# Patient Record
Sex: Female | Born: 1935 | Race: White | Smoking: Never smoker
Health system: Southern US, Community
[De-identification: ages and names within clinical notes are randomized; demographics above are authoritative.]

## PROBLEM LIST (undated history)

## (undated) DIAGNOSIS — F32A Depression, unspecified: Secondary | ICD-10-CM

## (undated) DIAGNOSIS — F419 Anxiety disorder, unspecified: Secondary | ICD-10-CM

## (undated) DIAGNOSIS — I4891 Unspecified atrial fibrillation: Secondary | ICD-10-CM

## (undated) DIAGNOSIS — K222 Esophageal obstruction: Secondary | ICD-10-CM

## (undated) DIAGNOSIS — I442 Atrioventricular block, complete: Secondary | ICD-10-CM

## (undated) DIAGNOSIS — K5792 Diverticulitis of intestine, part unspecified, without perforation or abscess without bleeding: Secondary | ICD-10-CM

## (undated) DIAGNOSIS — E785 Hyperlipidemia, unspecified: Secondary | ICD-10-CM

## (undated) DIAGNOSIS — F329 Major depressive disorder, single episode, unspecified: Secondary | ICD-10-CM

## (undated) DIAGNOSIS — M1990 Unspecified osteoarthritis, unspecified site: Secondary | ICD-10-CM

## (undated) DIAGNOSIS — I10 Essential (primary) hypertension: Secondary | ICD-10-CM

## (undated) HISTORY — PX: APPENDECTOMY: SHX54

## (undated) HISTORY — DX: Depression, unspecified: F32.A

## (undated) HISTORY — DX: Hyperlipidemia, unspecified: E78.5

## (undated) HISTORY — DX: Unspecified osteoarthritis, unspecified site: M19.90

## (undated) HISTORY — PX: CHOLECYSTECTOMY: SHX55

## (undated) HISTORY — DX: Diverticulitis of intestine, part unspecified, without perforation or abscess without bleeding: K57.92

## (undated) HISTORY — PX: ABDOMINAL HYSTERECTOMY: SHX81

## (undated) HISTORY — DX: Major depressive disorder, single episode, unspecified: F32.9

## (undated) HISTORY — DX: Esophageal obstruction: K22.2

## (undated) HISTORY — DX: Anxiety disorder, unspecified: F41.9

## (undated) HISTORY — DX: Unspecified atrial fibrillation: I48.91

## (undated) HISTORY — DX: Essential (primary) hypertension: I10

---

## 2013-11-11 ENCOUNTER — Encounter: Payer: Self-pay | Admitting: Gastroenterology

## 2013-11-16 ENCOUNTER — Encounter: Payer: Self-pay | Admitting: Gastroenterology

## 2013-11-16 ENCOUNTER — Ambulatory Visit (INDEPENDENT_AMBULATORY_CARE_PROVIDER_SITE_OTHER): Payer: Medicare Other | Admitting: Gastroenterology

## 2013-11-16 VITALS — BP 132/68 | HR 72 | Ht 64.0 in | Wt 119.6 lb

## 2013-11-16 DIAGNOSIS — R1314 Dysphagia, pharyngoesophageal phase: Secondary | ICD-10-CM

## 2013-11-16 NOTE — Patient Instructions (Addendum)
You will be set up for an upper endoscopy (balloon dilation likely) for dysphagia. You should consider dentures on the bottom, this will help your chewing and swallowing. Chew your food well, eat slowly, take small bites.

## 2013-11-16 NOTE — Progress Notes (Signed)
HPI: This is a   very pleasant 80 year old woman who is here with her daughter today  At night, while laying down. She coughs a lot.  + intermittent dysphagia.  Trouble breathing when laying down.  Has been on oxygen.  Has afib but not on blood thinners because of frequent falls.  She is very unclear about  Food will hang, catch with eating.  She does not have bottom teeth  Her weight has been stable if not increasing a bit since she moved in with her daughter  [place].  [place]ophagus [place] in [place] about 2[place] ago (in [place] She doesn't know what they found or exactly what they did.  She cannot recall the name of the physician, where it was done, the name of the facility where it was done, exactly when that was done. She doesn't recall the name of her primary care physician who referred to the gastroenterologist and thinks he has retired any   Review of systems: Pertinent positive and negative review of systems were noted in the above HPI section. Complete review of systems was performed and was otherwise normal.    History reviewed. No pertinent past medical history.  Past Surgical History  Procedure Laterality Date  . Appendectomy    . Cholecystectomy    . Abdominal hysterectomy      Current Outpatient Prescriptions  Medication Sig Dispense Refill  . amiodarone (PACERONE) 200 MG tablet Take 200 mg by mouth 2 (two) times daily.      Marland Kitchen. aspirin 81 MG tablet Take 81 mg by mouth daily.      . calcium carbonate (OS-CAL) 600 MG TABS tablet Take 600 mg by mouth daily with breakfast.      . ezetimibe (ZETIA) 10 MG tablet Take 10 mg by mouth daily.      . Inulin (METAMUCIL CLEAR & NATURAL) POWD Take by mouth as directed.      . mirtazapine (REMERON) 15 MG tablet Take 15 mg by mouth at bedtime.      Marland Kitchen. omeprazole (PRILOSEC) 40 MG capsule Take 40 mg by mouth daily.      . raloxifene (EVISTA) 60 MG tablet Take 60 mg by mouth daily.       No current  facility-administered medications for this visit.    Allergies as of 11/16/2013 - Review Complete 11/16/2013  Allergen Reaction Noted  . Sulfa antibiotics  11/16/2013    No family history on file.  History   Social History  . Marital Status: Unknown    Spouse Name: N/A    Number of Children: 4  . Years of Education: N/A   Occupational History  . Retired    Social History Main Topics  . Smoking status: Never Smoker   . Smokeless tobacco: Never Used  . Alcohol Use: Yes  . Drug Use: No  . Sexual Activity: Not on file   Other Topics Concern  . Not on file   Social History Narrative  . No narrative on file       Physical Exam: BP 132/68  Pulse 72  Ht 5\' 4"  (1.626 m)  Wt 119 lb 9.6 oz (54.25 kg)  BMI 20.52 kg/m2 Constitutional: Frail, walks with a walker Psychiatric: alert and oriented x3 Eyes: extraocular movements intact Mouth: oral pharynx moist, no lesions; no bottom teeth Neck: supple no lymphadenopathy Cardiovascular: heart regular rate and rhythm Lungs: clear to auscultation bilaterally Abdomen: soft, nontender, nondistended, no obvious ascites, no peritoneal signs, normal  bowel sounds Extremities: no lower extremity edema bilaterally Skin: no lesions on visible extremities    Assessment and plan: 80 y.o. female with  intermittent dysphagia to solid foods   it does not eem like we willble to track down her previous EGD records,   Since  she is unable to recall exactly when it was done, where was done, who did it, her primary care physician around that time has retired. He does seem that she has intermittent solid food dysphagia. She has no bottom teeth and I explained to her that this could likely contributes to her swallowing difficulty. I urged her to consider getting good fitting bottom teeth. I would like to proceed with EGD at her soonest convenience as well.

## 2013-11-30 ENCOUNTER — Encounter: Payer: Self-pay | Admitting: Gastroenterology

## 2013-11-30 ENCOUNTER — Ambulatory Visit (AMBULATORY_SURGERY_CENTER): Payer: Medicaid Other | Admitting: Gastroenterology

## 2013-11-30 VITALS — BP 127/69 | HR 65 | Temp 98.2°F | Resp 20 | Ht 64.0 in | Wt 119.0 lb

## 2013-11-30 DIAGNOSIS — R1314 Dysphagia, pharyngoesophageal phase: Secondary | ICD-10-CM

## 2013-11-30 DIAGNOSIS — K449 Diaphragmatic hernia without obstruction or gangrene: Secondary | ICD-10-CM

## 2013-11-30 DIAGNOSIS — K222 Esophageal obstruction: Secondary | ICD-10-CM

## 2013-11-30 MED ORDER — SODIUM CHLORIDE 0.9 % IV SOLN: 500.0000 mL | INTRAVENOUS | Status: DC

## 2013-11-30 NOTE — Patient Instructions (Signed)
YOU HAD AN ENDOSCOPIC PROCEDURE TODAY AT THE [facility]: Refer to the procedure report that was given to you for any specific questions about what was found during the examination.  If the procedure report does not answer your questions, please call your gastroenterologist to clarify.  If you requested that your care partner not be given the details of your procedure findings, then the procedure report has been included in a sealed envelope for you to review at your convenience later.  YOU SHOULD EXPECT: Some feelings of bloating in the abdomen. Passage of more gas than usual.  Walking can help get rid of the air that was put into your GI tract during the procedure and reduce the bloating. If you had a lower endoscopy (such as a colonoscopy or flexible sigmoidoscopy) you may notice spotting of blood in your stool or on the toilet paper. If you underwent a bowel prep for your procedure, then you may not have a normal bowel movement for a few days.  DIET: FOLLOW DILATION HANDOUT.  ACTIVITY: Your care partner should take you home directly after the procedure.  You should plan to take it easy, moving slowly for the rest of the day.  You can resume normal activity the day after the procedure however you should NOT DRIVE or use heavy machinery for 24 hours (because of the sedation medicines used during the test).    SYMPTOMS TO REPORT IMMEDIATELY: A gastroenterologist can be reached at any hour.  During normal business hours, 8:30 AM to 5:00 PM Monday through Friday, call [phone].  After hours and on weekends, please call the GI answering service at [phone] who will take a message and have the physician on call contact you.    Following upper endoscopy (EGD)  Vomiting of blood or coffee ground material  New chest pain or pain under the shoulder blades  Painful or persistently difficult swallowing  New shortness of breath  Fever of 100F or higher  Black, tarry-looking  stools  FOLLOW UP: If any biopsies were taken you will be contacted by phone or by letter within the next 1-3 weeks.  Call your gastroenterologist if you have not heard about the biopsies in 3 weeks.  Our staff will call the home number listed on your records the next business day following your procedure to check on you and address any questions or concerns that you may have at that time regarding the information given to you following your procedure. This is a courtesy call and so if there is no answer at the home number and we have not heard from you through the emergency physician on call, we will assume that you have returned to your regular daily activities without incident.  SIGNATURES/CONFIDENTIALITY: You and/or your care partner have signed paperwork which will be entered into your electronic medical record.  These signatures attest to the fact that that the information above on your After Visit Summary has been reviewed and is understood.  Full responsibility of the confidentiality of this discharge information lies with you and/or your care-partner.  Resume medications. Information given on Dilation Diet.

## 2013-11-30 NOTE — Op Note (Signed)
[facility] [address] [place], [phone]   ENDOSCOPY PROCEDURE REPORT  PATIENT: Julie Serrano, Julie H.  MR#: [id number] BIRTHDATE: May 11, 1925 , 89  yrs. old GENDER: Female ENDOSCOPIST: Rachael Feeaniel P Kynnedi Zweig, MD PROCEDURE DATE:  11/30/2013 PROCEDURE:  EGD, balloon dilation ASA CLASS:     Class III INDICATIONS:  dysphagia, previous EGD (elsewhere) and dilation 2-3 years ago with good relief of same symptoms. MEDICATIONS: MAC sedation, administered by CRNA and Propofol (Diprivan) 120 mg IV TOPICAL ANESTHETIC: none  DESCRIPTION OF PROCEDURE: After the risks benefits and alternatives of the procedure were thoroughly explained, informed consent was obtained.  The LB [id number] endoscope was introduced through the mouth and advanced to the second portion of the duodenum. Without limitations.  The instrument was slowly withdrawn as the mucosa was fully examined.   There was a 13mm Schatzki's ring at GE junction above a 4cm hiatal hernia.  The examination was otherwise normal.  The ring was dilated to 18mm using a CRE TTS balloon held inflated for 1 minute. There was the typical minor superficial tear and self limited oozing of blood following dilation.  Retroflexed views revealed no abnormalities.     The scope was then withdrawn from the patient and the procedure completed. COMPLICATIONS: There were no complications.  ENDOSCOPIC IMPRESSION: There was a 13mm Schatzki's ring at GE junction above a 4cm hiatal hernia.  The examination was otherwise normal.  The ring was dilated to 18mm using a CRE TTS balloon held inflated for 1 minute. There was the typical minor superficial tear and self limited oozing of blood following dilation.  RECOMMENDATIONS: Chew your food well, eat slowly and take small bites.  You should consider getting bottom dentures to help you chew better as well.   eSigned:  Rachael Feeaniel P Emari Demmer, MD 11/30/2013 2:28 PM   CC: Garnette Scheuermannhomas Parrish,  MD

## 2013-11-30 NOTE — Progress Notes (Signed)
Called to room to assist during endoscopic procedure.  Patient ID and intended procedure confirmed with present staff. Received instructions for my participation in the procedure from the performing physician.  

## 2013-11-30 NOTE — Progress Notes (Signed)
Report to pacu rn, vss, bbs=clear 

## 2013-12-01 ENCOUNTER — Telehealth: Payer: Self-pay

## 2013-12-01 NOTE — Telephone Encounter (Signed)
  Follow up Call-  Call back number 11/30/2013  Post procedure Call Back phone  # [phone]  Permission to leave phone message Yes     Patient questions:  Do you have a fever, pain , or abdominal swelling? no Pain Score  0 *  Have you tolerated food without any problems? yes  Have you been able to return to your normal activities? yes  Do you have any questions about your discharge instructions: Diet   no Medications  no Follow up visit  no  Do you have questions or concerns about your Care? no  Actions: * If pain score is 4 or above: No action needed, pain <4.

## 2013-12-26 ENCOUNTER — Emergency Department (HOSPITAL_COMMUNITY): Payer: Medicare Other

## 2013-12-26 ENCOUNTER — Emergency Department (HOSPITAL_COMMUNITY)
Admission: EM | Admit: 2013-12-26 | Discharge: 2013-12-26 | Disposition: A | Discharge status: Home / Self Care | Payer: Medicare Other | Source: Home / Self Care | Attending: Emergency Medicine | Admitting: Emergency Medicine

## 2013-12-26 ENCOUNTER — Encounter (HOSPITAL_COMMUNITY): Payer: Self-pay | Admitting: Emergency Medicine

## 2013-12-26 DIAGNOSIS — R42 Dizziness and giddiness: Secondary | ICD-10-CM

## 2013-12-26 LAB — COMPREHENSIVE METABOLIC PANEL
ALT: 25 U/L (ref 0–35)
AST: 33 U/L (ref 0–37)
Albumin: 3.2 g/dL — ABNORMAL LOW (ref 3.5–5.2)
Alkaline Phosphatase: 48 U/L (ref 39–117)
BUN: 10 mg/dL (ref 6–23)
CALCIUM: 8.4 mg/dL (ref 8.4–10.5)
CO2: 25 mEq/L (ref 19–32)
CREATININE: 0.98 mg/dL (ref 0.50–1.10)
Chloride: 103 mEq/L (ref 96–112)
GFR calc non Af Amer: 50 mL/min — ABNORMAL LOW (ref >90)
GFR, EST AFRICAN AMERICAN: 58 mL/min — AB (ref >90)
GLUCOSE: 120 mg/dL — AB (ref 70–99)
Potassium: 3.9 mEq/L (ref 3.7–5.3)
Sodium: 138 mEq/L (ref 137–147)
Total Bilirubin: 0.3 mg/dL (ref 0.3–1.2)
Total Protein: 6 g/dL (ref 6.0–8.3)

## 2013-12-26 LAB — CBC WITH DIFFERENTIAL/PLATELET
Basophils Absolute: 0 10*3/uL (ref 0.0–0.1)
Basophils Relative: 0 % (ref 0–1)
EOS ABS: 0.1 10*3/uL (ref 0.0–0.7)
EOS PCT: 1 % (ref 0–5)
HCT: 37.7 % (ref 36.0–46.0)
Hemoglobin: 12.6 g/dL (ref 12.0–15.0)
LYMPHS ABS: 0.9 10*3/uL (ref 0.7–4.0)
LYMPHS PCT: 15 % (ref 12–46)
MCH: 31.5 pg (ref 26.0–34.0)
MCHC: 33.4 g/dL (ref 30.0–36.0)
MCV: 94.3 fL (ref 78.0–100.0)
MONO ABS: 0.6 10*3/uL (ref 0.1–1.0)
Monocytes Relative: 9 % (ref 3–12)
Neutro Abs: 4.7 10*3/uL (ref 1.7–7.7)
Neutrophils Relative %: 75 % (ref 43–77)
Platelets: 147 10*3/uL — ABNORMAL LOW (ref 150–400)
RBC: 4 MIL/uL (ref 3.87–5.11)
RDW: 13.5 % (ref 11.5–15.5)
WBC: 6.3 10*3/uL (ref 4.0–10.5)

## 2013-12-26 LAB — URINALYSIS, ROUTINE W REFLEX MICROSCOPIC
BILIRUBIN URINE: NEGATIVE
Glucose, UA: NEGATIVE mg/dL
Ketones, ur: NEGATIVE mg/dL
LEUKOCYTES UA: NEGATIVE
NITRITE: NEGATIVE
Protein, ur: NEGATIVE mg/dL
SPECIFIC GRAVITY, URINE: 1.01 (ref 1.005–1.030)
UROBILINOGEN UA: 0.2 mg/dL (ref 0.0–1.0)
pH: 7 (ref 5.0–8.0)

## 2013-12-26 LAB — URINE MICROSCOPIC-ADD ON

## 2013-12-26 LAB — TROPONIN I: Troponin I: <0.3 ng/mL (ref <0.30)

## 2013-12-26 NOTE — ED Provider Notes (Signed)
CSN: [id number]     Arrival date & time 12/26/13  1703 History  This chart was scribed for Gerhard Munchobert Baruch Lewers, MD by Shari HeritageAisha Amuda, ED Scribe. The patient was seen in room APA19/APA19. Patient's care was started at 5:05 PM.  Chief Complaint  Patient presents with  . Loss of Consciousness    The history is provided by the patient. No language interpreter was used.    HPI Comments: Julie Serrano is a 80 y.o. female with history of atrial fibrillation, hypertension, hyperlipidemia who presents to the Emergency Department complaining of dizziness that began 1-2 hours ago. Patient reports that she was trying to get out of the car and started to feel unsteady. She denies a fall or injury after this episode. She denies loss of consciousness or lightheadedness.She says that she has been experiencing intermittent dizziness for the past one month and she describes this as a room-spinning sensation. She says that episodes do not last "too long," but cannot state exactly how long they are. Patient also states that she has some weakness in her lower extremities at baseline. She has had some congestion and shortness of breath over the past few days. She denies pain or any other complaints at this time. Patient's other medical history includes anxiety and depression, and diverticulitis. She has history of MI or other cardiac disease.   Past Medical History  Diagnosis Date  . Anxiety and depression   . Arrhythmia   . Arthritis   . Atrial fibrillation   . Hypertension   . Hyperlipemia   . Diverticulitis   . Esophageal stricture    Past Surgical History  Procedure Laterality Date  . Appendectomy    . Cholecystectomy    . Abdominal hysterectomy     No family history on file. History  Substance Use Topics  . Smoking status: Never Smoker   . Smokeless tobacco: Never Used  . Alcohol Use: Yes   OB History   Grav Para Term Preterm Abortions TAB SAB Ect Mult Living                 Review of Systems   Constitutional:       Per HPI, otherwise negative  HENT:       Per HPI, otherwise negative  Respiratory:       Per HPI, otherwise negative  Cardiovascular:       Per HPI, otherwise negative  Gastrointestinal: Negative for vomiting.  Endocrine:       Negative aside from HPI  Genitourinary:       Neg aside from HPI   Musculoskeletal:       Per HPI, otherwise negative  Skin: Negative.   Neurological: Positive for dizziness and weakness. Negative for syncope and headaches.     Allergies  Sulfa antibiotics  Home Medications   Prior to Admission medications   Medication Sig Start Date End Date Taking? Authorizing Provider  amiodarone (PACERONE) 200 MG tablet Take 200 mg by mouth 2 (two) times daily.    Historical Provider, MD  aspirin 81 MG tablet Take 81 mg by mouth daily.    Historical Provider, MD  calcium carbonate (OS-CAL) 600 MG TABS tablet Take 600 mg by mouth daily with breakfast.    Historical Provider, MD  ezetimibe (ZETIA) 10 MG tablet Take 10 mg by mouth daily.    Historical Provider, MD  guaiFENesin-dextromethorphan (ROBITUSSIN DM) 100-10 MG/5ML syrup Take 5 mLs by mouth every 8 (eight) hours as needed for cough.  Historical Provider, MD  Inulin (METAMUCIL CLEAR & NATURAL) POWD Take by mouth as directed.    Historical Provider, MD  mirtazapine (REMERON) 15 MG tablet Take 15 mg by mouth at bedtime.    Historical Provider, MD  omeprazole (PRILOSEC) 40 MG capsule Take 40 mg by mouth daily.    Historical Provider, MD  raloxifene (EVISTA) 60 MG tablet Take 60 mg by mouth daily.    Historical Provider, MD   Triage Vitals: BP 138/65  Pulse 62  Temp(Src) 98.6 F (37 C) (Oral)  Resp 18  SpO2 95% Physical Exam  Nursing note and vitals reviewed. Constitutional: She is oriented to person, place, and time. She appears well-developed and well-nourished. No distress.  HENT:  Head: Normocephalic and atraumatic.  Eyes: Conjunctivae and EOM are normal.  Cardiovascular:  Normal rate and regular rhythm.   Murmur heard. Pulmonary/Chest: Effort normal and breath sounds normal. No stridor. No respiratory distress.  Abdominal: She exhibits no distension.  Musculoskeletal: She exhibits no edema.  4/5 strength in all four extremities. Normal grip strength.  Neurological: She is alert and oriented to person, place, and time. No cranial nerve deficit.  No confusion. No dis-coordination.  Skin: Skin is warm and dry.  Psychiatric: She has a normal mood and affect.    ED Course  Procedures (including critical care time)  COORDINATION OF CARE: 5:22 PM- Will order head CT, CXR, CBC with diff, CMP, UA, and troponin. Patient informed of current plan for treatment and evaluation and agrees with plan at this time.    Labs Review Labs Reviewed  CBC WITH DIFFERENTIAL - Abnormal; Notable for the following:    Platelets 147 (*)    All other components within normal limits  COMPREHENSIVE METABOLIC PANEL - Abnormal; Notable for the following:    Glucose, Bld 120 (*)    Albumin 3.2 (*)    GFR calc non Af Amer 50 (*)    GFR calc Af Amer 58 (*)    All other components within normal limits  TROPONIN I  URINALYSIS, ROUTINE W REFLEX MICROSCOPIC    Imaging Review Dg Chest 2 View  12/26/2013   CLINICAL DATA:  Chest discomfort. Cough. Shortness of breath. Altered mental status.  EXAM: CHEST  2 VIEW  COMPARISON:  None.  FINDINGS: Mild cardiomegaly and ectasia of the thoracic aorta noted. No evidence of pulmonary edema or other infiltrate. No evidence of pleural effusion. No mass or lymphadenopathy identified.  IMPRESSION: Mild cardiomegaly.  No active lung disease.   Electronically Signed   By: Myles RosenthalJohn  Stahl M.D.   On: 12/26/2013 18:43   Ct Head Wo Contrast  12/26/2013   CLINICAL DATA:  Altered mental status. Hypertension and atrial fibrillation.  EXAM: CT HEAD WITHOUT CONTRAST  TECHNIQUE: Contiguous axial images were obtained from the base of the skull through the vertex without  intravenous contrast.  COMPARISON:  None.  FINDINGS: There is no evidence of intracranial hemorrhage, brain edema, or other signs of acute infarction. There is no evidence of intracranial mass lesion or mass effect. No abnormal extraaxial fluid collections are identified.  Mild diffuse cerebral atrophy noted as well as chronic small vessel disease. Ventricles are normal in size. No skull abnormality identified.  IMPRESSION: No acute intracranial abnormality.  Mild diffuse cerebral atrophy and chronic small vessel disease.   Electronically Signed   By: Myles RosenthalJohn  Stahl M.D.   On: 12/26/2013 18:21     EKG Interpretation   Date/Time:  Saturday December 26 2013 17:08:56 EDT  Ventricular Rate:  61 PR Interval:  194 QRS Duration: 216 QT Interval:  560 QTC Calculation: 563 R Axis:   -16 Text Interpretation:  Normal sinus rhythm Possible Left atrial enlargement  Left bundle branch block Abnormal ECG No previous ECGs available Sinus  rhythm Left bundle branch block Abnormal ekg Confirmed by Gerhard Munch   MD [phone]) on 12/26/2013 5:23:47 PM     On repeat exam, the patient is in no distress, sitting upright. She, her family members had a lengthy conversation about the possible causes for her dizziness.  I recommended specific all questions, imaging studies to pursue as an outpatient.  Absent distress, and with multiple prior similar events over some time, there is low suspicion for acute new pathology. MDM   Final diagnoses:  Dizziness   I personally performed the services described in this documentation, which was scribed in my presence. The recorded information has been reviewed and is accurate.  Patient presents after an episode of dizziness, but not near syncope. Patient is neurologically intact and hemodynamically stable, and in no distress. Patient's chronic dizzy episodes, the absence of distress, and the aforementioned reassuring findings an interpreter for further evaluation, management as an  outpatient.    Gerhard Munch, MD 12/27/13 [phone]

## 2013-12-26 NOTE — ED Notes (Signed)
Patient lying on bed; family at bedside.  Patient alert.  Respirations even and unlabored; able to speak in complete sentences without difficulty.  Patient denies pain at this time.
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
## 2013-12-26 NOTE — Discharge Instructions (Signed)
As discussed, your evaluation tonight has been largely reassuring.  Please be sure to follow up with your physician for further evaluation and management.  Additional evaluation is required, and you should discuss carotid vein studies, echocardiogram, possibly MRI to be done as an outpatient.  1 if you develop new, or concerning changes in the interim, please be sure to return here for further evaluation.   Dizziness Dizziness is a common problem. It is a feeling of unsteadiness or lightheadedness. You may feel like you are about to faint. Dizziness can lead to injury if you stumble or fall. A person of any age group can suffer from dizziness, but dizziness is more common in older adults. CAUSES  Dizziness can be caused by many different things, including:  Middle ear problems.  Standing for too long.  Infections.  An allergic reaction.  Aging.  An emotional response to something, such as the sight of blood.  Side effects of medicines.  Fatigue.  Problems with circulation or blood pressure.  Excess use of alcohol, medicines, or illegal drug use.  Breathing too fast (hyperventilation).  An arrhythmia or problems with your heart rhythm.  Low red blood cell count (anemia).  Pregnancy.  Vomiting, diarrhea, fever, or other illnesses that cause dehydration.  Diseases or conditions such as Parkinson's disease, high blood pressure (hypertension), diabetes, and thyroid problems.  Exposure to extreme heat. DIAGNOSIS  To find the cause of your dizziness, your caregiver may do a physical exam, lab tests, radiologic imaging scans, or an electrocardiography test (ECG).  TREATMENT  Treatment of dizziness depends on the cause of your symptoms and can vary greatly. HOME CARE INSTRUCTIONS   Drink enough fluids to keep your urine clear or pale yellow. This is especially important in very hot weather. In the elderly, it is also important in cold weather.  If your dizziness is  caused by medicines, take them exactly as directed. When taking blood pressure medicines, it is especially important to get up slowly.  Rise slowly from chairs and steady yourself until you feel okay.  In the morning, first sit up on the side of the bed. When this seems okay, stand slowly while holding onto something until you know your balance is fine.  If you need to stand in one place for a long time, be sure to move your legs often. Tighten and relax the muscles in your legs while standing.  If dizziness continues to be a problem, have someone stay with you for a day or two. Do this until you feel you are well enough to stay alone. Have the person call your caregiver if he or she notices changes in you that are concerning.  Do not drive or use heavy machinery if you feel dizzy.  Do not drink alcohol. SEEK IMMEDIATE MEDICAL CARE IF:   Your dizziness or lightheadedness gets worse.  You feel nauseous or vomit.  You develop problems with talking, walking, weakness, or using your arms, hands, or legs.  You are not thinking clearly or you have difficulty forming sentences. It may take a friend or family member to determine if your thinking is normal.  You develop chest pain, abdominal pain, shortness of breath, or sweating.  Your vision changes.  You notice any bleeding.  You have side effects from medicine that seems to be getting worse rather than better. MAKE SURE YOU:   Understand these instructions.  Will watch your condition.  Will get help right away if you are not doing well  or get worse. Document Released: 02/20/2001 Document Revised: 11/19/2011 Document Reviewed: 03/16/2011 [facility] Patient Information 2014 [employer].

## 2013-12-26 NOTE — ED Notes (Signed)
Dr. Jeraldine LootsLockwood in to speak with patient regarding discharge.

## 2013-12-26 NOTE — ED Notes (Signed)
Discharge instructions given and reviewed with patient and daughter.  Daughter verbalized understanding to follow up with PMD next week for further management of dizziness.

## 2013-12-26 NOTE — ED Notes (Signed)
Dressing applied to left lower leg.  Non-stick dressing applied and leg wrapped with kerlix.  Patient tolerated well.

## 2013-12-27 ENCOUNTER — Ambulatory Visit (HOSPITAL_COMMUNITY): Admit: 2013-12-27 | Payer: Self-pay | Admitting: Interventional Cardiology

## 2013-12-27 ENCOUNTER — Encounter (HOSPITAL_COMMUNITY)
Admission: EM | Disposition: A | Discharge status: Skilled Nursing Facility | Payer: Self-pay | Source: Home / Self Care | Attending: Cardiology

## 2013-12-27 ENCOUNTER — Inpatient Hospital Stay (HOSPITAL_COMMUNITY)
Admission: EM | Admit: 2013-12-27 | Discharge: 2013-12-30 | DRG: 242 | Disposition: A | Discharge status: Skilled Nursing Facility | Payer: Medicare Other | Attending: Cardiology | Admitting: Cardiology

## 2013-12-27 ENCOUNTER — Encounter (HOSPITAL_COMMUNITY): Payer: Self-pay | Admitting: Emergency Medicine

## 2013-12-27 DIAGNOSIS — E876 Hypokalemia: Secondary | ICD-10-CM | POA: Diagnosis present

## 2013-12-27 DIAGNOSIS — R410 Disorientation, unspecified: Secondary | ICD-10-CM | POA: Diagnosis present

## 2013-12-27 DIAGNOSIS — R55 Syncope and collapse: Secondary | ICD-10-CM | POA: Diagnosis present

## 2013-12-27 DIAGNOSIS — I4891 Unspecified atrial fibrillation: Secondary | ICD-10-CM | POA: Diagnosis present

## 2013-12-27 DIAGNOSIS — IMO0002 Reserved for concepts with insufficient information to code with codable children: Secondary | ICD-10-CM

## 2013-12-27 DIAGNOSIS — Z794 Long term (current) use of insulin: Secondary | ICD-10-CM

## 2013-12-27 DIAGNOSIS — F0390 Unspecified dementia without behavioral disturbance: Secondary | ICD-10-CM | POA: Diagnosis present

## 2013-12-27 DIAGNOSIS — R5381 Other malaise: Secondary | ICD-10-CM | POA: Diagnosis present

## 2013-12-27 DIAGNOSIS — F329 Major depressive disorder, single episode, unspecified: Secondary | ICD-10-CM | POA: Diagnosis present

## 2013-12-27 DIAGNOSIS — E785 Hyperlipidemia, unspecified: Secondary | ICD-10-CM | POA: Diagnosis present

## 2013-12-27 DIAGNOSIS — F3289 Other specified depressive episodes: Secondary | ICD-10-CM | POA: Diagnosis present

## 2013-12-27 DIAGNOSIS — I447 Left bundle-branch block, unspecified: Secondary | ICD-10-CM | POA: Diagnosis present

## 2013-12-27 DIAGNOSIS — I442 Atrioventricular block, complete: Principal | ICD-10-CM

## 2013-12-27 DIAGNOSIS — F411 Generalized anxiety disorder: Secondary | ICD-10-CM | POA: Diagnosis present

## 2013-12-27 DIAGNOSIS — Z7982 Long term (current) use of aspirin: Secondary | ICD-10-CM

## 2013-12-27 DIAGNOSIS — I10 Essential (primary) hypertension: Secondary | ICD-10-CM | POA: Diagnosis present

## 2013-12-27 DIAGNOSIS — M129 Arthropathy, unspecified: Secondary | ICD-10-CM | POA: Diagnosis present

## 2013-12-27 DIAGNOSIS — I214 Non-ST elevation (NSTEMI) myocardial infarction: Secondary | ICD-10-CM | POA: Diagnosis not present

## 2013-12-27 DIAGNOSIS — Z9181 History of falling: Secondary | ICD-10-CM

## 2013-12-27 HISTORY — PX: LEFT HEART CATHETERIZATION WITH CORONARY ANGIOGRAM: SHX5451

## 2013-12-27 HISTORY — DX: Atrioventricular block, complete: I44.2

## 2013-12-27 LAB — CBG MONITORING, ED: GLUCOSE-CAPILLARY: 161 mg/dL — AB (ref 70–99)

## 2013-12-27 SURGERY — LEFT HEART CATHETERIZATION WITH CORONARY ANGIOGRAM
Anesthesia: LOCAL

## 2013-12-27 MED ORDER — EZETIMIBE 10 MG PO TABS
10.0000 mg | ORAL_TABLET | Freq: Every day | ORAL | Status: DC
  Administered 2013-12-28 – 2013-12-29 (×2): 10 mg via ORAL
  Filled 2013-12-27 (×4): qty 1

## 2013-12-27 MED ORDER — ENOXAPARIN SODIUM 40 MG/0.4ML ~~LOC~~ SOLN
40.0000 mg | SUBCUTANEOUS | Status: DC
  Filled 2013-12-27: qty 0.4

## 2013-12-27 MED ORDER — DOPAMINE-DEXTROSE 3.2-5 MG/ML-% IV SOLN
INTRAVENOUS | Status: AC
  Administered 2013-12-27: 800 mg
  Filled 2013-12-27: qty 250

## 2013-12-27 MED ORDER — FENTANYL CITRATE 0.05 MG/ML IJ SOLN
12.5000 ug | Freq: Once | INTRAMUSCULAR | Status: AC
  Administered 2013-12-27: 12.5 ug via INTRAVENOUS
  Filled 2013-12-27: qty 2

## 2013-12-27 MED ORDER — SODIUM CHLORIDE 0.9 % IV SOLN
Freq: Once | INTRAVENOUS | Status: AC
  Administered 2013-12-27: 22:00:00 via INTRAVENOUS

## 2013-12-27 MED ORDER — ATROPINE SULFATE 0.1 MG/ML IJ SOLN
1.0000 mg | Freq: Once | INTRAMUSCULAR | Status: AC
  Administered 2013-12-27: 1 mg via INTRAVENOUS

## 2013-12-27 MED ORDER — ASPIRIN EC 81 MG PO TBEC: 81.0000 mg | DELAYED_RELEASE_TABLET | Freq: Every day | ORAL | Status: DC

## 2013-12-27 MED ORDER — ASPIRIN EC 81 MG PO TBEC
81.0000 mg | DELAYED_RELEASE_TABLET | Freq: Every day | ORAL | Status: DC
  Administered 2013-12-28 – 2013-12-30 (×3): 81 mg via ORAL
  Filled 2013-12-27 (×3): qty 1

## 2013-12-27 MED ORDER — NITROGLYCERIN 0.4 MG SL SUBL: 0.4000 mg | SUBLINGUAL_TABLET | SUBLINGUAL | Status: DC | PRN

## 2013-12-27 MED ORDER — SODIUM CHLORIDE 0.9 % IJ SOLN
3.0000 mL | Freq: Two times a day (BID) | INTRAMUSCULAR | Status: DC
  Administered 2013-12-28 – 2013-12-30 (×5): 3 mL via INTRAVENOUS

## 2013-12-27 MED ORDER — SODIUM CHLORIDE 0.9 % IV SOLN: INTRAVENOUS | Status: DC

## 2013-12-27 MED ORDER — PANTOPRAZOLE SODIUM 40 MG PO TBEC
40.0000 mg | DELAYED_RELEASE_TABLET | Freq: Every day | ORAL | Status: DC
  Administered 2013-12-28 – 2013-12-30 (×3): 40 mg via ORAL
  Filled 2013-12-27 (×3): qty 1

## 2013-12-27 MED ORDER — SODIUM CHLORIDE 0.9 % IV SOLN: 250.0000 mL | INTRAVENOUS | Status: DC | PRN

## 2013-12-27 MED ORDER — SODIUM CHLORIDE 0.9 % IJ SOLN: 3.0000 mL | INTRAMUSCULAR | Status: DC | PRN

## 2013-12-27 MED ORDER — HEPARIN (PORCINE) IN NACL 2-0.9 UNIT/ML-% IJ SOLN
INTRAMUSCULAR | Status: AC
  Filled 2013-12-27: qty 500

## 2013-12-27 NOTE — ED Notes (Signed)
Patient is opening eyes spontaneously at this time- no verbal response. Dr. Anitra LauthPlunkett and Dr. Donnie Ahoilley remains at bedside.

## 2013-12-27 NOTE — ED Notes (Addendum)
Dr. Anitra LauthPlunkett at bedside. Patient currently being placed on zoll and code cart at bedside. Patient is being paced. Patient responsive to painful stimuli only at this time.

## 2013-12-27 NOTE — H&P (Signed)
History and Physical   Admit date: 12/27/2013 Name:  Julie Serrano Medical record number: [id number] DOB/Age:  05-04-1925  80 y.o. female  [facility]  Chief complaint/reason for admission: Syncope   HPI:  This 80 year old female formerly lived in [place] and moved in February because of fraility to live with her daughter. She evidently saw a cardiologist Dr. [place]urcio [place]Harris [place] and is currently taking amiodarone. She may have been on blood thinners in the past but is not currently on warfarin. I cannot get a significant reason for her taking this from the daughter. She has become more frail and has to walk with a walker and has a very unsteady gait evidently. She has evidently restless leg syndrome but has good memory according to the daughter. She doesn't have any angina but may have a prior history of hypertension. She had a syncopal episode where she fell backwards when getting out of her car and was taken to the emergency room in [place] where she was evaluated with a head CT, chest x-ray and blood work yesterday that was unremarkable. She went home today and this evening was watching television with her family when she became slumped and unresponsive. EMS arrived and she was alert stable vitals but in route to hear her heart rate dropped to 26 and she became diaphoretic. EMS attempted pacemaking and had spontaneous return of her heart rate without pacemaking and they gave her 2 mg of Ativan. On arrival to the ER she was lethargic. She had a rhythm initially of left bundle and sinus but then developed complete heart block and periods of asystole and external pacing not able to give a good perfusion with the external pacer. again attempted. She is currently not responding well. Attempted to  get the external pacemaker to capture but could not get a consistent pulse with that.    Past Medical History  Diagnosis Date  . Anxiety and depression   .  Arrhythmia   . Arthritis   . Atrial fibrillation   . Hypertension   . Hyperlipemia   . Diverticulitis   . Esophageal stricture        Past Surgical History  Procedure Laterality Date  . Appendectomy    . Cholecystectomy    . Abdominal hysterectomy     Allergies: is allergic to sulfa antibiotics.   Medications: Prior to Admission medications   Medication Sig Start Date End Date Taking? Authorizing Provider  acetaminophen (TYLENOL) 650 MG CR tablet Take 650 mg by mouth once as needed for pain.    Historical Provider, MD  amiodarone (PACERONE) 200 MG tablet Take 200 mg by mouth 2 (two) times daily.    Historical Provider, MD  aspirin EC 81 MG tablet Take 81 mg by mouth daily.    Historical Provider, MD  calcium carbonate (OS-CAL) 600 MG TABS tablet Take 600 mg by mouth at bedtime.     Historical Provider, MD  diphenhydramine-acetaminophen (TYLENOL PM) 25-500 MG TABS Take 1 tablet by mouth at bedtime.    Historical Provider, MD  Docusate Calcium (STOOL SOFTENER PO) Take 100 mg by mouth at bedtime.    Historical Provider, MD  ezetimibe (ZETIA) 10 MG tablet Take 10 mg by mouth at bedtime.     Historical Provider, MD  guaiFENesin-dextromethorphan (ROBITUSSIN DM) 100-10 MG/5ML syrup Take 5 mLs by mouth at bedtime as needed for cough.     Historical Provider, MD  Inulin (METAMUCIL CLEAR & NATURAL)  POWD Take by mouth daily. Teaspoonful daily    Historical Provider, MD  loratadine (CLARITIN) 10 MG tablet Take 10 mg by mouth at bedtime.    Historical Provider, MD  mineral oil liquid Take 15-30 mLs by mouth once.    Historical Provider, MD  mirtazapine (REMERON) 15 MG tablet Take 15 mg by mouth at bedtime.    Historical Provider, MD  omeprazole (PRILOSEC) 40 MG capsule Take 40 mg by mouth every morning.     Historical Provider, MD  raloxifene (EVISTA) 60 MG tablet Take 60 mg by mouth daily.    Historical Provider, MD    Family History:  No family status information on file.   able to obtain  due to critical condition  Social History: Widow and currently lives with her daughter since February. Nonsmoker   History   Social History Narrative  . No narrative on file     Review of Systems:  Unable to obtain Other than as noted above, the remainder of the review of systems is normal  Physical Exam: BP 96/66  Pulse 70  Temp(Src) 97.5 F (36.4 C) (Axillary)  Resp 26  SpO2 92%  General appearance: She is lethargic elderly female who am not able to get any meaningful history from. External pacemaker is currently functioning. Head: Normocephalic, without obvious abnormality, atraumatic Neck: no adenopathy, no carotid bruit, no JVD and supple, symmetrical, trachea midline Lungs: clear to auscultation bilaterally Heart: Heart sounds are slow and distance Abdomen: soft, non-tender; bowel sounds normal; no masses,  no organomegaly Extremities: Linear scar over right forearm, trace edema, Pulses: Femoral pulses are very slow but bounding when she has a pulse, distal pulses difficult to feel, unable to ascertain external pacer is capturing  Neurologic: Not able to adequately assess, does move her arms, due to sedation can't really assess over.  Labs: CBC  Recent Labs  12/26/13 1740  WBC 6.3  RBC 4.00  HGB 12.6  HCT 37.7  PLT 147*  MCV 94.3  MCH 31.5  MCHC 33.4  RDW 13.5  LYMPHSABS 0.9  MONOABS 0.6  EOSABS 0.1  BASOSABS 0.0   CMP   Recent Labs  12/26/13 1740  NA 138  K 3.9  CL 103  CO2 25  GLUCOSE 120*  BUN 10  CREATININE 0.98  CALCIUM 8.4  PROT 6.0  ALBUMIN 3.2*  AST 33  ALT 25  ALKPHOS 48  BILITOT 0.3  GFRNONAA 50*  GFRAA 58*   Cardiac Panel (last 3 results)  Recent Labs  12/26/13 1740  TROPONINI <0.30    EKG: Initially sinus with under branch block pattern  Radiology: Done yesterday shows cardiomegaly and a patient of the thoracic aorta   IMPRESSIONS: 1. Syncope due to complete heart block 2. Left bundle branch block 3.  Amiodarone treatment possible history of atrial fibrillation 4. Hypertension 5. Frailty  PLAN: The patient will need to have an urgent temporary pacemaker wire implanted. At the very nice he has been notified. She likely will are permanent pacemaker. She is on amiodarone  for unclear reasons and old records will need to be obtained. Obtain lab work and serial enzymes. Check echocardiogram in the morning.  Signed: Darden PalmerW. Spencer Ashtyn Meland, Jr. MD [facility]  12/27/2013, 10:29 PM

## 2013-12-27 NOTE — CV Procedure (Signed)
    PROCEDURE:  Temporary transvenous pacer.  INDICATIONS:  Complete heart block  The risks, benefits, and details of the procedure were explained to the patient.  The patient verbalized understanding and wanted to proceed.  Informed written consent was obtained.  PROCEDURE TECHNIQUE:  After Xylocaine anesthesia a 25F sheath was placed in the right femoral vein with a single anterior needle wall stick.  A balloon tip temporary pacer wire was advanced under fluoroscopic guidance to the right ventricle. With the balloon inflated, the catheter wouldn't selectively right ventricular outflow tract. When the balloon was deflated, it would drop into the right ventricle, but not always to the apex. Capture was lost at 0.8 milliamps. Heart rate was set to 70 beats per minute. Output is 3 milliamps. The pacer is in just past 50 cm.   CONTRAST:  Total of 0 cc.  COMPLICATIONS:  None.     IMPRESSIONS:  1. Successful placement of temporary transvenous pacer with the above settings.  RECOMMENDATION:  She will go to the CCU. She will need EP consultation and likely permanent pacemaker placement.  She has been somewhat confused. We will recommend restraining her right leg and both arms with soft restraints to prevent her from removing the temporary pacemaker.

## 2013-12-27 NOTE — Progress Notes (Signed)
Chaplain escorted patient's family to cath lab waiting. Notified medical team of family's location and communicated with family. Provided emotional support. Will refer to unit chaplain in the AM, please page for follow up.   Maurene CapesHillary D Irusta [phone]

## 2013-12-27 NOTE — ED Notes (Signed)
Patient presents to Ed via [facility]. Patient was at home with family watching tv when her family noticed that patient was "slumped" and "not responding." By the time fire and EMS arrived patient was A&Ox4 with stable vitals. In route to ED patient heart rate dropped to 26 bpm. And became diaphoretic. EMS attempted to pace patient but never received "capture." Patient heart rate returned to 70-75bpm without pacing. EMS gave patient 2mg  Ativan. Upon arrival to ED patient is lethargic but A&Ox3. No acute distress noted at this time.

## 2013-12-27 NOTE — ED Provider Notes (Signed)
CSN: [id number]     Arrival date & time 12/27/13  2037 History   First MD Initiated Contact with Patient 12/27/13 2107     Chief Complaint  Patient presents with  . Loss of Consciousness     (Consider location/radiation/quality/duration/timing/severity/associated sxs/prior Treatment) HPI Comments: Since pt was given ativan for EMS she was altered adn drowsy but oriented to person.  Patient is a 80 y.o. female presenting with syncope. The history is provided by the patient and a relative. The history is limited by the condition of the patient and the absence of a caregiver.  Loss of Consciousness Episode history:  Single Most recent episode:  Today Timing:  Constant Progression:  Resolved Chronicity:  New Context comment:  Seen yesterday at [facility] for dizziness and near syncope with normal work up  and d/ced home and then today had syncopal event while sitting. Witnessed: yes   Relieved by:  Nothing Worsened by:  Nothing tried Ineffective treatments:  None tried Associated symptoms comment:  Family noticed that she was slumped over in the chair.  When EMS arrived she was awake and alert.  Pt does not remember the event.  EMS felt that she may have a heart block and started externally packing her and then gave her atropine.  However no noted bradycardia.   Past Medical History  Diagnosis Date  . Anxiety and depression   . Arrhythmia   . Arthritis   . Atrial fibrillation   . Hypertension   . Hyperlipemia   . Diverticulitis   . Esophageal stricture    Past Surgical History  Procedure Laterality Date  . Appendectomy    . Cholecystectomy    . Abdominal hysterectomy     No family history on file. History  Substance Use Topics  . Smoking status: Never Smoker   . Smokeless tobacco: Never Used  . Alcohol Use: Yes   OB History   Grav Para Term Preterm Abortions TAB SAB Ect Mult Living                 Review of Systems  Unable to perform ROS Cardiovascular: Positive for  syncope.      Allergies  Sulfa antibiotics  Home Medications   Prior to Admission medications   Medication Sig Start Date End Date Taking? Authorizing Provider  acetaminophen (TYLENOL) 650 MG CR tablet Take 650 mg by mouth once as needed for pain.    Historical Provider, MD  amiodarone (PACERONE) 200 MG tablet Take 200 mg by mouth 2 (two) times daily.    Historical Provider, MD  aspirin EC 81 MG tablet Take 81 mg by mouth daily.    Historical Provider, MD  calcium carbonate (OS-CAL) 600 MG TABS tablet Take 600 mg by mouth at bedtime.     Historical Provider, MD  diphenhydramine-acetaminophen (TYLENOL PM) 25-500 MG TABS Take 1 tablet by mouth at bedtime.    Historical Provider, MD  Docusate Calcium (STOOL SOFTENER PO) Take 100 mg by mouth at bedtime.    Historical Provider, MD  ezetimibe (ZETIA) 10 MG tablet Take 10 mg by mouth at bedtime.     Historical Provider, MD  guaiFENesin-dextromethorphan (ROBITUSSIN DM) 100-10 MG/5ML syrup Take 5 mLs by mouth at bedtime as needed for cough.     Historical Provider, MD  Inulin (METAMUCIL CLEAR & NATURAL) POWD Take by mouth daily. Teaspoonful daily    Historical Provider, MD  loratadine (CLARITIN) 10 MG tablet Take 10 mg by mouth at bedtime.  Historical Provider, MD  mineral oil liquid Take 15-30 mLs by mouth once.    Historical Provider, MD  mirtazapine (REMERON) 15 MG tablet Take 15 mg by mouth at bedtime.    Historical Provider, MD  omeprazole (PRILOSEC) 40 MG capsule Take 40 mg by mouth every morning.     Historical Provider, MD  raloxifene (EVISTA) 60 MG tablet Take 60 mg by mouth daily.    Historical Provider, MD   BP 119/69  Pulse 75  Temp(Src) 97.5 F (36.4 C) (Axillary)  Resp 28  SpO2 93% Physical Exam  Nursing note and vitals reviewed. Constitutional: She appears well-developed and well-nourished. No distress.  HENT:  Head: Normocephalic and atraumatic.  Mouth/Throat: Oropharynx is clear and moist.  Eyes: Conjunctivae and EOM  are normal. Pupils are equal, round, and reactive to light.  Neck: Normal range of motion. Neck supple.  Cardiovascular: Normal rate, regular rhythm and intact distal pulses.   Murmur heard.  Systolic murmur is present with a grade of 3/6  Pulmonary/Chest: Effort normal and breath sounds normal. No respiratory distress. She has no wheezes. She has no rales.  Abdominal: Soft. She exhibits no distension. There is no tenderness. There is no rebound and no guarding.  Musculoskeletal: Normal range of motion. She exhibits no edema and no tenderness.  Neurological: She is alert.  Oriented to person only  Skin: Skin is warm and dry. No rash noted. No erythema.  Psychiatric: She has a normal mood and affect. Her behavior is normal.    ED Course  External pacer Date/Time: 12/27/2013 11:30 PM Performed by: Gwyneth SproutPLUNKETT, Inioluwa Baris Authorized by: Gwyneth SproutPLUNKETT, Maat Kafer Consent: The procedure was performed in an emergent situation. Risks and benefits: risks, benefits and alternatives were discussed Consent given by: daughter. Local anesthesia used: no Patient sedated: no Patient tolerance: Patient tolerated the procedure well with no immediate complications. Comments: Pt externally paced at a rate of 70 at 40mamps.  Capture achieved with persistent perfusion   (including critical care time) Labs Review Labs Reviewed - No data to display  Imaging Review Dg Chest 2 View  12/26/2013   CLINICAL DATA:  Chest discomfort. Cough. Shortness of breath. Altered mental status.  EXAM: CHEST  2 VIEW  COMPARISON:  None.  FINDINGS: Mild cardiomegaly and ectasia of the thoracic aorta noted. No evidence of pulmonary edema or other infiltrate. No evidence of pleural effusion. No mass or lymphadenopathy identified.  IMPRESSION: Mild cardiomegaly.  No active lung disease.   Electronically Signed   By: Myles RosenthalJohn  Stahl M.D.   On: 12/26/2013 18:43   Ct Head Wo Contrast  12/26/2013   CLINICAL DATA:  Altered mental status. Hypertension  and atrial fibrillation.  EXAM: CT HEAD WITHOUT CONTRAST  TECHNIQUE: Contiguous axial images were obtained from the base of the skull through the vertex without intravenous contrast.  COMPARISON:  None.  FINDINGS: There is no evidence of intracranial hemorrhage, brain edema, or other signs of acute infarction. There is no evidence of intracranial mass lesion or mass effect. No abnormal extraaxial fluid collections are identified.  Mild diffuse cerebral atrophy noted as well as chronic small vessel disease. Ventricles are normal in size. No skull abnormality identified.  IMPRESSION: No acute intracranial abnormality.  Mild diffuse cerebral atrophy and chronic small vessel disease.   Electronically Signed   By: Myles RosenthalJohn  Stahl M.D.   On: 12/26/2013 18:21     EKG Interpretation   Date/Time:  Sunday December 27 2013 20:51:42 EDT Ventricular Rate:  68 PR Interval:  196 QRS Duration: 216 QT Interval:  570 QTC Calculation: 606 R Axis:   -19 Text Interpretation:  Sinus rhythm Left bundle branch block No significant  change since last tracing Confirmed by Anitra Lauth  MD, Alphonzo Lemmings ([id number]) on  12/27/2013 9:14:59 PM      MDM   Final diagnoses:  Complete heart block    Patient presented via EMS when she had a syncopal episode at home with family. Upon arrival here patient is awake but groggy because she received 2 mg of Ativan per EMS prior to arrival because initially they thought they would need to pace her but did not. She did receive atropine upon arrival here her heart rate is in the 60s to 70s.  Labs done yesterday were within normal limits including a normal creatinine, blood sugar, head CT and a chest x-ray that showed only mild cardiomegaly. The plan was to admit the patient however while she was still in the emergency room she developed a bradycardic rhythm in the 20s and then became exceedingly altered with further drop of the heart rate. Patient was given atropine without improvement in external pacing  was started. She was capturing at around 50-70 beats per minutes and would have intermittent episodes of lucid this. Her blood sugar remains greater than 100. Cardiology was called immediately and Cath Lab was called in for temporary pacing. All information was discussed with her daughter who is at bedside and consents for her to get an emergent pacer. I accompanied the patient to the Cath Lab where she remained stable and upon arrival there and we gained a rate in the 50s and was more awake.  CRITICAL CARE Performed by: Granvel Proudfoot Total critical care time: 60 Critical care time was exclusive of separately billable procedures and treating other patients. Critical care was necessary to treat or prevent imminent or life-threatening deterioration. Critical care was time spent personally by me on the following activities: development of treatment plan with patient and/or surrogate as well as nursing, discussions with consultants, evaluation of patient's response to treatment, examination of patient, obtaining history from patient or surrogate, ordering and performing treatments and interventions, ordering and review of laboratory studies, ordering and review of radiographic studies, pulse oximetry and re-evaluation of patient's condition.    Gwyneth Sprout, MD 12/27/13 [phone]

## 2013-12-27 NOTE — ED Notes (Signed)
Patient remains on zoll being paced at this time.

## 2013-12-28 ENCOUNTER — Encounter (HOSPITAL_COMMUNITY)
Admission: EM | Disposition: A | Discharge status: Skilled Nursing Facility | Payer: Self-pay | Source: Home / Self Care | Attending: Cardiology

## 2013-12-28 ENCOUNTER — Encounter (HOSPITAL_COMMUNITY): Payer: Self-pay | Admitting: Cardiology

## 2013-12-28 ENCOUNTER — Inpatient Hospital Stay (HOSPITAL_COMMUNITY): Payer: Medicare Other

## 2013-12-28 DIAGNOSIS — R55 Syncope and collapse: Secondary | ICD-10-CM

## 2013-12-28 DIAGNOSIS — I442 Atrioventricular block, complete: Secondary | ICD-10-CM

## 2013-12-28 DIAGNOSIS — I359 Nonrheumatic aortic valve disorder, unspecified: Secondary | ICD-10-CM

## 2013-12-28 DIAGNOSIS — I447 Left bundle-branch block, unspecified: Secondary | ICD-10-CM | POA: Diagnosis present

## 2013-12-28 DIAGNOSIS — I10 Essential (primary) hypertension: Secondary | ICD-10-CM

## 2013-12-28 DIAGNOSIS — R404 Transient alteration of awareness: Secondary | ICD-10-CM

## 2013-12-28 DIAGNOSIS — R410 Disorientation, unspecified: Secondary | ICD-10-CM | POA: Diagnosis present

## 2013-12-28 HISTORY — PX: PERMANENT PACEMAKER INSERTION: SHX5480

## 2013-12-28 HISTORY — PX: PACEMAKER INSERTION: SHX728

## 2013-12-28 LAB — BASIC METABOLIC PANEL
BUN: 12 mg/dL (ref 6–23)
CHLORIDE: 106 meq/L (ref 96–112)
CO2: 14 mEq/L — ABNORMAL LOW (ref 19–32)
CREATININE: 1 mg/dL (ref 0.50–1.10)
Calcium: 8 mg/dL — ABNORMAL LOW (ref 8.4–10.5)
GFR calc Af Amer: 57 mL/min — ABNORMAL LOW (ref >90)
GFR, EST NON AFRICAN AMERICAN: 49 mL/min — AB (ref >90)
Glucose, Bld: 193 mg/dL — ABNORMAL HIGH (ref 70–99)
Potassium: 4.2 mEq/L (ref 3.7–5.3)
Sodium: 139 mEq/L (ref 137–147)

## 2013-12-28 LAB — CBC
HEMATOCRIT: 40.7 % (ref 36.0–46.0)
Hemoglobin: 13.6 g/dL (ref 12.0–15.0)
MCH: 31.9 pg (ref 26.0–34.0)
MCHC: 33.4 g/dL (ref 30.0–36.0)
MCV: 95.5 fL (ref 78.0–100.0)
Platelets: 194 10*3/uL (ref 150–400)
RBC: 4.26 MIL/uL (ref 3.87–5.11)
RDW: 13.8 % (ref 11.5–15.5)
WBC: 14.1 10*3/uL — ABNORMAL HIGH (ref 4.0–10.5)

## 2013-12-28 LAB — APTT: aPTT: 25 seconds (ref 24–37)

## 2013-12-28 LAB — PROTIME-INR
INR: 1.28 (ref 0.00–1.49)
Prothrombin Time: 15.7 seconds — ABNORMAL HIGH (ref 11.6–15.2)

## 2013-12-28 LAB — TROPONIN I: TROPONIN I: 0.95 ng/mL — AB (ref <0.30)

## 2013-12-28 LAB — MRSA PCR SCREENING: MRSA by PCR: NEGATIVE

## 2013-12-28 LAB — TSH: TSH: 1.81 u[IU]/mL (ref 0.350–4.500)

## 2013-12-28 SURGERY — PERMANENT PACEMAKER INSERTION
Anesthesia: LOCAL

## 2013-12-28 MED ORDER — SODIUM CHLORIDE 0.9 % IV SOLN: INTRAVENOUS | Status: DC

## 2013-12-28 MED ORDER — ENOXAPARIN SODIUM 30 MG/0.3ML ~~LOC~~ SOLN
30.0000 mg | SUBCUTANEOUS | Status: DC
  Filled 2013-12-28: qty 0.3

## 2013-12-28 MED ORDER — ONDANSETRON HCL 4 MG/2ML IJ SOLN: 4.0000 mg | Freq: Four times a day (QID) | INTRAMUSCULAR | Status: DC | PRN

## 2013-12-28 MED ORDER — SODIUM CHLORIDE 0.9 % IR SOLN
80.0000 mg | Status: DC
  Administered 2013-12-28: 80 mg
  Filled 2013-12-28 (×2): qty 2

## 2013-12-28 MED ORDER — FLUMAZENIL 0.5 MG/5ML IV SOLN
0.2000 mg | Freq: Once | INTRAVENOUS | Status: AC
  Administered 2013-12-28: 0.2 mg via INTRAVENOUS
  Filled 2013-12-28: qty 5

## 2013-12-28 MED ORDER — ACETAMINOPHEN 325 MG PO TABS
325.0000 mg | ORAL_TABLET | ORAL | Status: DC | PRN
Start: 2013-12-28 — End: 2013-12-30
  Administered 2013-12-28 – 2013-12-30 (×4): 650 mg via ORAL
  Filled 2013-12-28 (×4): qty 2

## 2013-12-28 MED ORDER — CEFAZOLIN SODIUM 1-5 GM-% IV SOLN
1.0000 g | Freq: Four times a day (QID) | INTRAVENOUS | Status: AC
  Administered 2013-12-28 – 2013-12-29 (×3): 1 g via INTRAVENOUS
  Filled 2013-12-28 (×3): qty 50

## 2013-12-28 MED ORDER — CHLORHEXIDINE GLUCONATE 4 % EX LIQD
60.0000 mL | Freq: Once | CUTANEOUS | Status: AC
  Administered 2013-12-28: 4 via TOPICAL
  Filled 2013-12-28: qty 15

## 2013-12-28 MED ORDER — CEFAZOLIN SODIUM-DEXTROSE 2-3 GM-% IV SOLR
2.0000 g | INTRAVENOUS | Status: DC
  Administered 2013-12-28: 2 g via INTRAVENOUS
  Filled 2013-12-28: qty 50

## 2013-12-28 MED FILL — Medication: Qty: 1 | Status: AC

## 2013-12-28 NOTE — CV Procedure (Signed)
    PROCEDURE:  Temporary transvenous pacer.  INDICATIONS:  Complete heart block- failure of prior pacer  Emergency consent was obtained from family.  PROCEDURE TECHNIQUE:  A 78F sheath was replaced  in the right femoral vein with a single anterior needle wall stick.  A balloon tip temporary pacer wire was advanced under fluoroscopic guidance to the right ventricle. The catheter was curved sharply to point down. When the balloon was deflated, it would drop into the right ventricle, and this time to the apex. Capture was lost at 0.8 milliamps. Heart rate was set to 70 beats per minute. Output is 3 milliamps. The pacer is in just past 50 cm.   CONTRAST:  Total of 0 cc.  COMPLICATIONS:  None.     IMPRESSIONS:  1. Successful replacement of temporary transvenous pacer with the above settings.  RECOMMENDATION:  She will go to the CCU. She will need EP consultation and likely permanent pacemaker placement.  She has been somewhat confused. We will recommend restraining her right leg and both arms with soft restraints to prevent her from removing the temporary pacemaker.

## 2013-12-28 NOTE — Progress Notes (Signed)
Pt taken back to cath lab. Accompanied by nurse, and tech.

## 2013-12-28 NOTE — Progress Notes (Signed)
Subjective:  Confused this am. Restrained because of pacer wire in right groin. Had to go back to cath lab to reposition wire.    Objective:  Vital Signs in the last 24 hours: Temp:  [97.5 F (36.4 C)-98.5 F (36.9 C)] 98.5 F (36.9 C) (04/20 0733) Pulse Rate:  [30-77] 70 (04/20 0733) Resp:  [13-31] 31 (04/20 0733) BP: (68-143)/(43-117) 112/74 mmHg (04/20 0733) SpO2:  [89 %-98 %] 96 % (04/20 0733) Weight:  [121 lb 4.1 oz (55 kg)] 121 lb 4.1 oz (55 kg) (04/19 2230)  Intake/Output from previous day: 04/19 0701 - 04/20 0700 In: 362 [I.V.:362] Out: 400 [Urine:400]   Physical Exam: General: Elderly, in no acute distress. Head:  Normocephalic and atraumatic. Lungs: Clear to auscultation and percussion. Heart: Normal S1 and S2.  No murmur, rubs or gallops.  Abdomen: soft, non-tender, positive bowel sounds. Extremities: No clubbing or cyanosis. No edema. Groin temp pacer sheath/wire.  Neurologic: Confused but awake.    Lab Results:  Recent Labs  12/26/13 1740 12/28/13 0034  WBC 6.3 14.1*  HGB 12.6 13.6  PLT 147* 194    Recent Labs  12/26/13 1740 12/28/13 0034  NA 138 139  K 3.9 4.2  CL 103 106  CO2 25 14*  GLUCOSE 120* 193*  BUN 10 12  CREATININE 0.98 1.00    Recent Labs  12/26/13 1740 12/28/13 0034  TROPONINI <0.30 0.95*   Hepatic Function Panel  Recent Labs  12/26/13 1740  PROT 6.0  ALBUMIN 3.2*  AST 33  ALT 25  ALKPHOS 48  BILITOT 0.3    Imaging: Dg Chest 2 View  12/26/2013   CLINICAL DATA:  Chest discomfort. Cough. Shortness of breath. Altered mental status.  EXAM: CHEST  2 VIEW  COMPARISON:  None.  FINDINGS: Mild cardiomegaly and ectasia of the thoracic aorta noted. No evidence of pulmonary edema or other infiltrate. No evidence of pleural effusion. No mass or lymphadenopathy identified.  IMPRESSION: Mild cardiomegaly.  No active lung disease.   Electronically Signed   By: Myles RosenthalJohn  Stahl M.D.   On: 12/26/2013 18:43   Ct Head Wo  Contrast  12/26/2013   CLINICAL DATA:  Altered mental status. Hypertension and atrial fibrillation.  EXAM: CT HEAD WITHOUT CONTRAST  TECHNIQUE: Contiguous axial images were obtained from the base of the skull through the vertex without intravenous contrast.  COMPARISON:  None.  FINDINGS: There is no evidence of intracranial hemorrhage, brain edema, or other signs of acute infarction. There is no evidence of intracranial mass lesion or mass effect. No abnormal extraaxial fluid collections are identified.  Mild diffuse cerebral atrophy noted as well as chronic small vessel disease. Ventricles are normal in size. No skull abnormality identified.  IMPRESSION: No acute intracranial abnormality.  Mild diffuse cerebral atrophy and chronic small vessel disease.   Electronically Signed   By: Myles RosenthalJohn  Stahl M.D.   On: 12/26/2013 18:21   Personally viewed.   Telemetry: V-Paced.  Personally viewed.   EKG:  V paced this am. Prior NSR with LBBB.   Cardiac Studies:  Trop 0.95. TSH 1.8  Assessment/Plan:  Principal Problem:   Complete heart block Active Problems:   Syncope   Hypertension  80 year old with underlying LBBB on amiodarone at home with syncope secondary to asystole/bradycardia with temporary venous pacer.   1) Syncope/conduction disorder  - EP to see for pacemaker (Discussed with Trish, on EP list to see).   - TSH normal  - stopped amiodarone (?  afib history)  2) Delerium  - ?ativan from EMS  - spoke to son in law on phone. She lives with them and for the most part is lucid, clear thinking, independent. Lives with daughter after the loss of her husband since Feb.   3) LBBB  - pacer  4) Hyperlipidemia  - Zetia  5) HTN  - stable.   6) Elevated troponin mild 0.95  - likely secondary to periods of bradycardia/asystole and not true ACS.   - ECHO pending.   7) Leukocytosis  - monitor, no fevers, watch for any signs of aspiration. Likely acute phase reactant.   8) DVT proph  - holding  Lovenox for pacer  Coca ColaMark Sylva Overley 12/28/2013, 8:29 AM

## 2013-12-28 NOTE — Consult Note (Signed)
ELECTROPHYSIOLOGY CONSULT NOTE   Patient ID: Julie Serrano MRN: [id number], DOB/AGE: Feb 02, 1925   Admit date: 12/27/2013 Date of Consult: 12/28/2013  Primary Physician: Dr. Ann Maki Primary Cardiologist: Dr. Tiburcio Pea in [place]. [place], [place] Reason for Consultation: Complete heart block  History of Present Illness Julie Serrano is a 80 y.o. female with atrial fibrillation, HTN, dyslipidemia and dementia who presented yesterday with syncope. Julie Serrano is currently confused and unable to provide history. Her daughter, with whom she lives, is at bedside and provides history. Her daughter reports she has had two episodes of syncope in the last week. Both were abrupt. The first occurred on Sat and they took her to [facility] ED. There she was evaluated with a head CT, chest x-ray and blood work that was unremarkable. She was observed overnight and discharged on Sunday. Sunday evening while sitting watching TV with her family she lost consciousness abruptly and without warning. Her daughter tells me "she just slumped over and it took about 5 minutes for her to come around." She had labored breathing with "snoring sounds." She has not complained of CP or dizziness recently but has had intermittent DOE. She ambulates slowly with rolling walker and has had frequent falls since Feb 2015.   While here she experienced recurrent syncope with documented complete heart block with asystole due to multiple consecutive nonconducted P waves on telemetry, requiring temporary pacing wire placement. Potassium 4.2. Troponin mildly elevated 0.95. TSH normal. ECG on admission shows sinus rhythm with LBBB, QRS duration 216. EP has been asked to see for recommendations regarding permanent pacemaker implantation.   Past Medical History Past Medical History  Diagnosis Date  . Anxiety and depression   . Arrhythmia   . Arthritis   . Atrial fibrillation   . Hypertension   . Hyperlipemia   . Diverticulitis   . Esophageal stricture     Past Surgical History Past Surgical History  Procedure Laterality Date  . Appendectomy    . Cholecystectomy    . Abdominal hysterectomy      Allergies/Intolerances Allergies  Allergen Reactions  . Ativan [Lorazepam] Other (See Comments)    Extreme delirium  . Sulfa Antibiotics     unknown    Current Home Medications      acetaminophen 650 MG CR tablet  Commonly known as:  TYLENOL  Take 650 mg by mouth once as needed for pain.     amiodarone 200 MG tablet  Commonly known as:  PACERONE  Take 200 mg by mouth 2 (two) times daily.     aspirin EC 81 MG tablet  Take 81 mg by mouth daily.     calcium carbonate 600 MG Tabs tablet  Commonly known as:  OS-CAL  Take 600 mg by mouth at bedtime.     diphenhydramine-acetaminophen 25-500 MG Tabs  Commonly known as:  TYLENOL PM  Take 1 tablet by mouth at bedtime.     ezetimibe 10 MG tablet  Commonly known as:  ZETIA  Take 10 mg by mouth at bedtime.     guaiFENesin-dextromethorphan 100-10 MG/5ML syrup  Commonly known as:  ROBITUSSIN DM  Take 5 mLs by mouth at bedtime as needed for cough.     loratadine 10 MG tablet  Commonly known as:  CLARITIN  Take 10 mg by mouth at bedtime.     METAMUCIL CLEAR & NATURAL Powd  Take by mouth daily. Teaspoonful daily     mineral oil liquid  Take 15-30 mLs by mouth once.  mirtazapine 15 MG tablet  Commonly known as:  REMERON  Take 15 mg by mouth at bedtime.     omeprazole 40 MG capsule  Commonly known as:  PRILOSEC  Take 40 mg by mouth every morning.     raloxifene 60 MG tablet  Commonly known as:  EVISTA  Take 60 mg by mouth daily.     STOOL SOFTENER PO  Take 100 mg by mouth at bedtime.     Inpatient Medications . aspirin EC  81 mg Oral Daily  . ezetimibe  10 mg Oral QHS  . pantoprazole  40 mg Oral Daily  . sodium chloride  3 mL Intravenous Q12H   . sodium chloride 50 mL/hr (12/27/13 2330)    Family History Per daughter, positive for CAD. Patient's son has  pacemaker.   Social History Social History  . Marital Status: Widowed   Social History Main Topics  . Smoking status: Never Smoker   . Smokeless tobacco: Never Used  . Alcohol Use: No  . Drug Use: No   Review of Systems General: No chills, fever, night sweats or weight changes  Cardiovascular:  No chest pain, dyspnea on exertion, edema, orthopnea, palpitations, paroxysmal nocturnal dyspnea Dermatological: No rash, lesions or masses Respiratory: No cough, dyspnea Urologic: No hematuria, dysuria Abdominal: No nausea, vomiting, diarrhea, bright red blood per rectum, melena, or hematemesis Neurologic: No visual changes, weakness, changes in mental status All other systems reviewed and are otherwise negative except as noted above.  Physical Exam Vitals: Blood pressure 112/74, pulse 70, temperature 98.5 F (36.9 C), temperature source Oral, resp. rate 31, height 5\' 4"  (1.626 m), weight 121 lb 4.1 oz (55 kg), SpO2 96.00%.  General: Well developed, elderly  80 y.o. female in no acute distress. HEENT: Normocephalic, atraumatic. EOMs intact. Sclera nonicteric. Oropharynx clear.  Neck: Supple. No JVD. Lungs: Shallow inspiration. Respirations regular and unlabored, CTA bilaterally. No wheezes, rales or rhonchi. Heart: Regular. S1, S2 present. No murmurs, rub, S3 or S4. Abdomen: Soft, non-tender, non-distended. BS present x 4 quadrants. No hepatosplenomegaly.  Extremities: No clubbing, cyanosis or edema. DP/PT/Radials 2+ and equal bilaterally. Psych: Normal affect. Neuro: Alert and oriented X 3. Moves all extremities spontaneously. Musculoskeletal: No kyphosis. Skin: Intact. Warm and dry. No rashes or petechiae in exposed areas.   Labs  Recent Labs  12/26/13 1740 12/28/13 0034  TROPONINI <0.30 0.95*   Lab Results  Component Value Date   WBC 14.1* 12/28/2013   HGB 13.6 12/28/2013   HCT 40.7 12/28/2013   MCV 95.5 12/28/2013   PLT 194 12/28/2013    Recent Labs Lab 12/26/13 1740  12/28/13 0034  NA 138 139  K 3.9 4.2  CL 103 106  CO2 25 14*  BUN 10 12  CREATININE 0.98 1.00  CALCIUM 8.4 8.0*  PROT 6.0  --   BILITOT 0.3  --   ALKPHOS 48  --   ALT 25  --   AST 33  --   GLUCOSE 120* 193*    Recent Labs  12/28/13 0034  TSH 1.810    Recent Labs  12/28/13 0239  INR 1.28    Radiology/Studies Dg Chest 2 View  12/26/2013   CLINICAL DATA:  Chest discomfort. Cough. Shortness of breath. Altered mental status.  EXAM: CHEST  2 VIEW  COMPARISON:  None.  FINDINGS: Mild cardiomegaly and ectasia of the thoracic aorta noted. No evidence of pulmonary edema or other infiltrate. No evidence of pleural effusion. No mass or lymphadenopathy identified.  IMPRESSION: Mild cardiomegaly.  No active lung disease.   Electronically Signed   By: Myles RosenthalJohn  Stahl M.D.   On: 12/26/2013 18:43   Ct Head Wo Contrast  12/26/2013   CLINICAL DATA:  Altered mental status. Hypertension and atrial fibrillation.  EXAM: CT HEAD WITHOUT CONTRAST  TECHNIQUE: Contiguous axial images were obtained from the base of the skull through the vertex without intravenous contrast.  COMPARISON:  None.  FINDINGS: There is no evidence of intracranial hemorrhage, brain edema, or other signs of acute infarction. There is no evidence of intracranial mass lesion or mass effect. No abnormal extraaxial fluid collections are identified.  Mild diffuse cerebral atrophy noted as well as chronic small vessel disease. Ventricles are normal in size. No skull abnormality identified.  IMPRESSION: No acute intracranial abnormality.  Mild diffuse cerebral atrophy and chronic small vessel disease.   Electronically Signed   By: Myles RosenthalJohn  Stahl M.D.   On: 12/26/2013 18:21    Echocardiogram  Pending  12-lead ECG on admission - SR with LBBB Telemetry reviewed in full - V paced at 70 bpm; overnight with complete heart block and asystole due to multiple consecutive nonconducted P waves   Assessment and Plan  1. Complete heart block, s/p  temporary PM  2. History of atrial fibrillation   3. Dementia 4. NSTEMI - troponin mildly elevated and most likely represents strain / supply-demand mismatch in setting of profound CHB  [name] presents with complete heart block that is hemodynamically unstable requiring placement of temporary pacing wire. Potassium normal. TSH normal. Echo pending. She has baseline conduction system disease with LBBB. No old / prior ECGs available for comparison. She has no history of CAD. Troponin mildly elevated and most likely represents strain / supply-demand mismatch in setting of profound CHB. There are no reversible causes identified. She meets criteria for PPM. Discussed the indication for PPM with [name] and her daughter. Also reviewed the procedure involved, including risks and benefits. These risks include, but are not limited to, bleeding, infection, pneumothorax, perforation, vascular damage, lead dislodgement, renal failure, MI, stroke and death. Ms. Dicie Beamaton's daughter (next of kin and primary caregiver) expressed verbal understanding and agrees to proceed with PPM implantation today.    SignedMinda Meo, Brooke O Edmisten, PA-C 12/28/2013, 9:41 AM  EP Attending  Patient seen and examined. The patient has Stokes-Adams syncope. I have discussed the risks/benefits/goals/expectations of PPM insertion with the patient and her daughter and they wish to proceed. She does not have a reversible cause. Her borderline troponin is not clinically important.   Leonia ReevesGregg Mylah Baynes,M.D.

## 2013-12-28 NOTE — Care Management Note (Addendum)
    Page 1 of 2   12/30/2013     11:16:50 AM CARE MANAGEMENT NOTE 12/30/2013  Patient:  Lilyan PuntATON,Tyrell H   Account Number:  [id number]  Date Initiated:  12/28/2013  Documentation initiated by:  Junius CreamerWELL,DEBBIE  Subjective/Objective Assessment:   adm w syncope, temp pacer     Action/Plan:   lives w da, pcp dr Garnette Scheuermannthomas parrish   Anticipated DC Date:  12/30/2013   Anticipated DC Plan:  SKILLED NURSING FACILITY  In-house referral  Clinical Social Worker      DC Planning Services  CM consult      [facility]  [facility] Of [facility]/PTA Provider   Choice offered to / List presented to:          [facility]  [facility] RN  [facility] PT      [facility]  [facility]   Status of service:   Medicare Important Message given?   (If response is "NO", the following Medicare IM given date fields will be blank) Date Medicare IM given:   Date Additional Medicare IM given:    Discharge Disposition:  SKILLED NURSING FACILITY  Per UR Regulation:  Reviewed for med. necessity/level of care/duration of stay  If discussed at Long Length of Stay Meetings, dates discussed:    Comments:  4/22  1115 debbie Bodi Palmeri rn,bsn sw working w da on short term snf. bed available today. dr Ladona Ridgeltaylor to have dc summary done for dc to snf today.  4/21 1054a debbie Angelee Bahr rn,bsn spoke w pt and da. pt very confused. da states just getting hhc. she was active w [facility]. [facility] will need final orders at [facility] to resume hhc.

## 2013-12-28 NOTE — Discharge Instructions (Signed)
° °  Supplemental Discharge Instructions for  Pacemaker/Defibrillator Patients  Activity No heavy lifting or vigorous activity with your left/right arm for 6 to 8 weeks.  Do not raise your left/right arm above your head for one week.  Gradually raise your affected arm as drawn below.           04/24                      04/25                       04/26                      04/27       NO DRIVING  WOUND CARE   Keep the wound area clean and dry.  Do not get this area wet for one week. No showers for one week; you may shower on 01/05/2014.   The tape/steri-strips on your wound will fall off; do not pull them off.  No bandage is needed on the site.  DO NOT apply any creams, oils, or ointments to the wound area.   If you notice any drainage or discharge from the wound, any swelling or bruising at the site, or you develop a fever > 101? F after you are discharged home, call the office at once.  Special Instructions   You are still able to use cellular telephones; use the ear opposite the side where you have your pacemaker/defibrillator.  Avoid carrying your cellular phone near your device.   When traveling through airports, show security personnel your identification card to avoid being screened in the metal detectors.  Ask the security personnel to use the hand wand.   Avoid arc welding equipment, MRI testing (magnetic resonance imaging), TENS units (transcutaneous nerve stimulators).  Call the office for questions about other devices.   Avoid electrical appliances that are in poor condition or are not properly grounded.   Microwave ovens are safe to be near or to operate.

## 2013-12-28 NOTE — Progress Notes (Signed)
Chaplain followed up with family while on the unit. They express exhaustion and concern that patient was taken back to to cath lab. Visited with patient and her daughter, providing emotional support. Please page for follow up.   Maurene CapesHillary D Irusta [phone]

## 2013-12-28 NOTE — Progress Notes (Addendum)
Patients heart rate/rhythm on the bedside monitor was 41 and pt was still in complete heart block, despite the temporary pacemaker set at a rate of 70, and MA of 10. Pt was moving arms all around bed and the nurse was reminding the pt to keep her leg still. Adjustments were made to the temporary pacemaker, however the pacemaker was not capturing. The pacer wire at the insertion site was still at 50. Zoll pacer pads placed on pt and external pacing started. Dr on call paged and stated he was going to call the interventional cardiologist on call. Will continue to externally pace. Rate set at 70, and MA at 10.

## 2013-12-28 NOTE — Progress Notes (Signed)
  Echocardiogram 2D Echocardiogram has been performed.  Julie GuildKwong Julie Serrano 12/28/2013, 10:25 AM

## 2013-12-29 ENCOUNTER — Inpatient Hospital Stay (HOSPITAL_COMMUNITY): Payer: Medicare Other

## 2013-12-29 ENCOUNTER — Encounter (HOSPITAL_COMMUNITY): Payer: Self-pay | Admitting: *Deleted

## 2013-12-29 LAB — CBC
HCT: 33.3 % — ABNORMAL LOW (ref 36.0–46.0)
HEMOGLOBIN: 11.1 g/dL — AB (ref 12.0–15.0)
MCH: 31.7 pg (ref 26.0–34.0)
MCHC: 33.3 g/dL (ref 30.0–36.0)
MCV: 95.1 fL (ref 78.0–100.0)
Platelets: 128 10*3/uL — ABNORMAL LOW (ref 150–400)
RBC: 3.5 MIL/uL — AB (ref 3.87–5.11)
RDW: 14.2 % (ref 11.5–15.5)
WBC: 12.5 10*3/uL — ABNORMAL HIGH (ref 4.0–10.5)

## 2013-12-29 MED ORDER — SALINE SPRAY 0.65 % NA SOLN
1.0000 | NASAL | Status: DC | PRN
  Administered 2013-12-30: 1 via NASAL
  Filled 2013-12-29: qty 44

## 2013-12-29 MED ORDER — FUROSEMIDE 10 MG/ML IJ SOLN
40.0000 mg | Freq: Once | INTRAMUSCULAR | Status: AC
  Administered 2013-12-29: 40 mg via INTRAVENOUS
  Filled 2013-12-29: qty 4

## 2013-12-29 NOTE — Progress Notes (Signed)
Pt is restless and confused. Has required constant attention. Pt is hallucinating, talking to people in the room that are not there. She is constantly throwing her legs over the side of the bed, sliding her feet down of the sides of the beds. Pulling at lines, and not able to be redirected, or distracted. Nurse currently sitting at patients bedside at all times for safety purposes. Will ask for family to try and be present in the future during the daytime/nighttime hours to help keep patient calm, and consolable.

## 2013-12-29 NOTE — CV Procedure (Signed)
Electrophysiology procedure note  Procedure: Insertion of a dual-chamber pacemaker  Indication: Symptomatic complete heart block, status post insertion of a temporary transvenous pacemaker  Description of procedure: After informed consent was obtained, the patient was taken to the diagnostic electrophysiology laboratory in the fasting state. After the usual preparation and draping, 30 cc of lidocaine was infiltrated into the left infraclavicular region. A 5 cm incision was carried out. Electrocautery was utilized to dissect down to the fascial plane. The left subclavian vein was punctured x2. The Medtronic model 5076, 52 cm, active fixation pacing lead, serial number [id number] was advanced into the right ventricle. The Medtronic model 5076 45 cm, active fixation pacing lead, serial number [id number], was advanced into the right atrium. Mapping was first carried out in the right ventricle. At the final site, paced R waves were 12 mV. This was on the RV apical septum. The pacing impedance was 732 ohms. The pacing threshold was 0.7 V at 0.5 ms. 10 V pacing did not stimulate the diaphragm. Attention was then turned to the atrial lead. The atrial lead was placed in the anterolateral portion of the right atrium. After active fixation, the P waves were 4 mV. The pacing impedance was 621 ohms. The patient threshold was 1 V at 0.5 ms. 10 V pacing did not stimulate the diaphragm. There was a large injury current. With the satisfactory parameters, a figure-of-eight silk suture was utilized to secure the leads to the fascia. The sewing sleeves were secured with silk suture. Electrocautery was utilized to make a subcutaneous pocket. Electrocautery was utilized to assure hemostasis. Antibiotic irrigation was utilized to irrigate the pocket. The Medtronic dual-chamber pacemaker, serial [id number] was connected to the atrial and ventricular pacing leads, and placed in the subcutaneous pocket. The pocket was irrigated  with additional irrigation. The incision was closed with 2 layers of Vicryl suture. Benzoin and Steri-Strips were painted on the skin. A pressure dressing was placed.  Complications: No immediate procedure complications  Results: Successful implantation of a Medtronic dual-chamber pacemaker in a patient with symptomatic complete heart block and no reversible causes.  Lewayne BuntingGregg Sincere Liuzzi, M.D.

## 2013-12-29 NOTE — Progress Notes (Signed)
Patient: Julie Serrano Date of Encounter: 12/29/2013, 7:04 AM Admit date: 12/27/2013     Subjective  Julie Serrano is disoriented this AM but denies CP or SOB.   Objective  Physical Exam: Vitals: BP 137/97  Pulse 80  Temp(Src) 98.4 F (36.9 C) (Oral)  Resp 27  Ht 5\' 4"  (1.626 m)  Wt 121 lb 4.1 oz (55 kg)  BMI 20.80 kg/m2  SpO2 98% General: Well developed, elderly 80 year old female in no acute distress. Neck: Supple. JVD not elevated. Lungs: Clear bilaterally to auscultation without wheezes, rales, or rhonchi. Breathing is unlabored. Heart: RRR S1 S2 without murmurs, rubs, or gallops.  Abdomen: Soft, non-distended. Extremities: No clubbing or cyanosis. No edema.  Distal pedal pulses are 2+ and equal bilaterally. Neuro: Alert and disoriented. Oriented only to self. Moves all extremities spontaneously.   Intake/Output:  Intake/Output Summary (Last 24 hours) at 12/29/13 0704 Last data filed at 12/29/13 0611  Gross per 24 hour  Intake   1350 ml  Output   1000 ml  Net    350 ml    Inpatient Medications:  . aspirin EC  81 mg Oral Daily  .  ceFAZolin (ANCEF) IV  1 g Intravenous 4 times per day  . ezetimibe  10 mg Oral QHS  . pantoprazole  40 mg Oral Daily  . sodium chloride  3 mL Intravenous Q12H   . sodium chloride 50 mL/hr (12/28/13 1648)    Labs:  Recent Labs  12/26/13 1740 12/28/13 0034  NA 138 139  K 3.9 4.2  CL 103 106  CO2 25 14*  GLUCOSE 120* 193*  BUN 10 12  CREATININE 0.98 1.00  CALCIUM 8.4 8.0*    Recent Labs  12/26/13 1740  AST 33  ALT 25  ALKPHOS 48  BILITOT 0.3  PROT 6.0  ALBUMIN 3.2*    Recent Labs  12/26/13 1740 12/28/13 0034 12/29/13 0250  WBC 6.3 14.1* 12.5*  NEUTROABS 4.7  --   --   HGB 12.6 13.6 11.1*  HCT 37.7 40.7 33.3*  MCV 94.3 95.5 95.1  PLT 147* 194 128*    Recent Labs  12/26/13 1740 12/28/13 0034  TROPONINI <0.30 0.95*    Recent Labs  12/28/13 0034  TSH 1.810    Recent Labs  12/28/13 0239  INR 1.28     Radiology/Studies: Dg Chest 2 View  12/26/2013   CLINICAL DATA:  Chest discomfort. Cough. Shortness of breath. Altered mental status.  EXAM: CHEST  2 VIEW  COMPARISON:  None.  FINDINGS: Mild cardiomegaly and ectasia of the thoracic aorta noted. No evidence of pulmonary edema or other infiltrate. No evidence of pleural effusion. No mass or lymphadenopathy identified.  IMPRESSION: Mild cardiomegaly.  No active lung disease.   Electronically Signed   By: Myles RosenthalJohn  Stahl M.D.   On: 12/26/2013 18:43   Ct Head Wo Contrast  12/26/2013   CLINICAL DATA:  Altered mental status. Hypertension and atrial fibrillation.  EXAM: CT HEAD WITHOUT CONTRAST  TECHNIQUE: Contiguous axial images were obtained from the base of the skull through the vertex without intravenous contrast.  COMPARISON:  None.  FINDINGS: There is no evidence of intracranial hemorrhage, brain edema, or other signs of acute infarction. There is no evidence of intracranial mass lesion or mass effect. No abnormal extraaxial fluid collections are identified.  Mild diffuse cerebral atrophy noted as well as chronic small vessel disease. Ventricles are normal in size. No skull abnormality identified.  IMPRESSION: No  acute intracranial abnormality.  Mild diffuse cerebral atrophy and chronic small vessel disease.   Electronically Signed   By: Myles RosenthalJohn  Stahl M.D.   On: 12/26/2013 18:21   Dg Chest Portable 1 View  12/28/2013   CLINICAL DATA:  post implant, R/O pneumothorax  EXAM: PORTABLE CHEST - 1 VIEW  COMPARISON:  DG CHEST 2 VIEW dated 12/26/2013  FINDINGS: The cardiac silhouette is enlarged. Atherosclerotic calcifications appreciated within the aortic arch. There is prominence of the interstitial markings and mild peribronchial cuffing. No focal regions of consolidation or focal infiltrates. No gross osseous abnormalities appreciated. A left-sided dual-chamber cardiac pacing unit is appreciated.  IMPRESSION: Pulmonary vascular congestion/mild edema without focal  regions of consolidation.   Electronically Signed   By: Salome HolmesHector  Cooper M.D.   On: 12/28/2013 18:12    Echocardiogram  Study Conclusions - Left ventricle: Difficult study as patient not cooperative. Flailing arms, pulling at probe. Difficult to get images. Overall LVEF is appears fo be 55 to 60%  with distal inferior hypokinesis. The LV cavity is small.  There is systolic anterior motion of anterior mitral leaflet.  The LVOT is narrow There is turbulent flow through the  LVOT with peak gradient through LVOT of 111 mmHg.  The cavity size was normal. Wall thickness was increased  in a pattern of severe LVH. - Aortic valve: Mild regurgitation. - Left atrium: The atrium was mildly dilated. - Pulmonary arteries: PA peak pressure: 37mm Hg (S).  12-lead ECG on admission - SR with LBBB  Telemetry reviewed in full - V paced at 70 bpm  Assessment and Plan  1. Complete heart block, s/p dual chamber PPM implant yesterday 2. History of atrial fibrillation  3. Dementia Julie Serrano is confused this AM. She was not able to keep left arm sling in place. Her implant site is intact without hematoma. Device interrogation demonstrates normal device function. Will order CXR. Also will order case management and PT consults for discharge planning.  Signed, Minda MeoBrooke O Edmisten PA-C  EP Attending  Patient seen and examined. Agree with above. Ok for discharge home. Her demential is not going to get better in the ICU. Her PPM is working normally. Usual follow up.  Leonia ReevesGregg Taylor,M.D.

## 2013-12-29 NOTE — Progress Notes (Signed)
PT in to work with patient. Patient unable to follow simple commands unable to stand to get out of bed. Brook PA notified orders given discharge to home cancelled. Daughter at bedside

## 2013-12-29 NOTE — Evaluation (Signed)
Physical Therapy Evaluation Patient Details Name: Julie Serrano MRN: [id number] DOB: Dec 02, 1924 Today's Date: 12/29/2013   History of Present Illness  Pt is an [age] y/o female who sustained a syncopal episode where she fell backwards when getting out of her car and was taken to the emergency room in [place] where she was evaluated with a head CT, chest x-ray and blood work that was unremarkable. She went home and that evening was watching television with her family when she became slumped and unresponsive. EMS arrived and she was alert stable vitals but in route her heart rate dropped to 26 and she became diaphoretic. She is now s/p insertion of a dual-chamber pacemaker.  Clinical Impression  Pt admitted after the above mentioned events. Pt currently with functional limitations due to the deficits listed below (see PT Problem List). At the time of PT eval, pt was oriented x0, and not able to follow commands. She is unable to transfer EOB due to safety, demonstrating active extension of posterior chain which put her at risk of sliding off the edge of the bed.  Pt will benefit from skilled PT to increase their independence and safety with mobility to allow discharge to the venue listed below.      Follow Up Recommendations SNF;Supervision/Assistance - 24 hour    Equipment Recommendations  Rolling walker with 5" wheels;3in1 (PT)    Recommendations for Other Services OT consult     Precautions / Restrictions Precautions Precautions: Fall;ICD/Pacemaker Restrictions Weight Bearing Restrictions: No      Mobility  Bed Mobility Overal bed mobility: +2 for physical assistance;+ 2 for safety/equipment;Needs Assistance Bed Mobility: Rolling;Supine to Sit Rolling: Mod assist;+2 for physical assistance   Supine to sit: +2 for physical assistance;+2 for safety/equipment;Total assist     General bed mobility comments: Pt with difficulty performing bed mobility tasks. +2 assist was required for  rolling and pt was unable to sit EOB safety. Pt appears to panic when attempting mobility and becomes very shaky. Even with +2 assist to elevate trunk, pt actively extends trunk and hips and is in danger of slipping off the edge of the bed. Because of this, mobility was limited during the eval.   Transfers                    Ambulation/Gait                Stairs            Wheelchair Mobility    Modified Rankin (Stroke Patients Only)       Balance                                             Pertinent Vitals/Pain None reported, RN present during session.    Home Living Family/patient expects to be discharged to:: Private residence Living Arrangements: Children (Dtr and son-in-law) Available Help at Discharge: Family;Available 24 hours/day Type of Home: House Home Access: Ramped entrance     Home Layout: One level Home Equipment: Walker - 4 wheels      Prior Function Level of Independence: Needs assistance   Gait / Transfers Assistance Needed: Assist for safety with ambulation - walker does not fit in bathroom  ADL's / Homemaking Assistance Needed: Assist for bathing dressing        Hand Dominance   Dominant Hand: Right  Extremity/Trunk Assessment   Upper Extremity Assessment: Defer to OT evaluation           Lower Extremity Assessment: Generalized weakness      Cervical / Trunk Assessment: Kyphotic  Communication   Communication: No difficulties  Cognition Arousal/Alertness: Awake/alert Behavior During Therapy: WFL for tasks assessed/performed Overall Cognitive Status: Impaired/Different from baseline Area of Impairment: Orientation;Attention;Memory;Safety/judgement;Awareness;Problem solving Orientation Level: Disoriented to;Person;Place;Time;Situation Current Attention Level: Sustained Memory: Decreased short-term memory   Safety/Judgement: Decreased awareness of deficits Awareness: Intellectual Problem  Solving: Decreased initiation;Slow processing      General Comments      Exercises        Assessment/Plan    PT Assessment Patient needs continued PT services  PT Diagnosis Difficulty walking;Generalized weakness   PT Problem List Decreased strength;Decreased activity tolerance;Decreased range of motion;Decreased balance;Decreased mobility;Decreased knowledge of use of DME;Decreased safety awareness;Decreased knowledge of precautions  PT Treatment Interventions DME instruction;Gait training;Stair training;Functional mobility training;Therapeutic activities;Therapeutic exercise;Neuromuscular re-education;Patient/family education   PT Goals (Current goals can be found in the Care Plan section) Acute Rehab PT Goals Patient Stated Goal: Unable to state - daughter's goal is for pt to return home with her PT Goal Formulation: With patient/family Time For Goal Achievement: 01/12/14 Potential to Achieve Goals: Good    Frequency Min 3X/week   Barriers to discharge        Co-evaluation               End of Session Equipment Utilized During Treatment: Other (comment) (Bed pad for scooting) Activity Tolerance: Patient limited by fatigue;Treatment limited secondary to agitation Patient left: in bed;with call bell/phone within reach;with family/visitor present Nurse Communication: Other (comment) (RN present during session)         Time: 1020-1058 PT Time Calculation (min): 38 min   Charges:   PT Evaluation $Initial PT Evaluation Tier I: 1 Procedure PT Treatments $Therapeutic Activity: 23-37 mins   PT G CodesRuthann Cancer:          Parisha Beaulac Hamilton 12/29/2013, 1:34 PM  Ruthann CancerLaura Hamilton, PT, DPT Acute Rehabilitation Services Pager: [phone]

## 2013-12-30 LAB — BASIC METABOLIC PANEL
BUN: 13 mg/dL (ref 6–23)
CALCIUM: 8.1 mg/dL — AB (ref 8.4–10.5)
CO2: 21 mEq/L (ref 19–32)
Chloride: 106 mEq/L (ref 96–112)
Creatinine, Ser: 0.89 mg/dL (ref 0.50–1.10)
GFR calc Af Amer: 66 mL/min — ABNORMAL LOW (ref >90)
GFR calc non Af Amer: 57 mL/min — ABNORMAL LOW (ref >90)
GLUCOSE: 105 mg/dL — AB (ref 70–99)
Potassium: 3.2 mEq/L — ABNORMAL LOW (ref 3.7–5.3)
Sodium: 142 mEq/L (ref 137–147)

## 2013-12-30 MED ORDER — AMIODARONE HCL 200 MG PO TABS: 200.0000 mg | ORAL_TABLET | Freq: Every day | ORAL | Status: AC

## 2013-12-30 MED ORDER — POTASSIUM CHLORIDE CRYS ER 20 MEQ PO TBCR
40.0000 meq | EXTENDED_RELEASE_TABLET | Freq: Once | ORAL | Status: DC
Start: 2013-12-30 — End: 2013-12-30

## 2013-12-30 MED ORDER — POTASSIUM CHLORIDE CRYS ER 20 MEQ PO TBCR
40.0000 meq | EXTENDED_RELEASE_TABLET | Freq: Once | ORAL | Status: AC
  Administered 2013-12-30: 40 meq via ORAL
  Filled 2013-12-30: qty 2

## 2013-12-30 NOTE — Progress Notes (Signed)
Patient: Julie Serrano Date of Encounter: 12/30/2013, 5:32 AM Admit date: 12/27/2013     Subjective  Julie Serrano is more alert and oriented this AM. She denies CP or SOB.   Objective  Physical Exam: Vitals: BP 130/49  Pulse 72  Temp(Src) 101.2 F (38.4 C) (Oral)  Resp 20  Ht 5\' 4"  (1.626 m)  Wt 121 lb 4.1 oz (55 kg)  BMI 20.80 kg/m2  SpO2 94% General: Well developed, elderly 80 year old female in no acute distress. Neck: Supple. JVD not elevated. Lungs: Clear bilaterally to auscultation without wheezes, rales, or rhonchi. Breathing is unlabored. Heart: Regular S1 S2 with III/VI systolic murmur best heard at apex. No rub or gallop.  Abdomen: Soft, non-distended. Extremities: No clubbing or cyanosis. No edema.  Distal pedal pulses are 2+ and equal bilaterally. Neuro: Alert and oriented to self and place. Moves all extremities spontaneously.   Intake/Output:  Intake/Output Summary (Last 24 hours) at 12/30/13 0532 Last data filed at 12/30/13 0500  Gross per 24 hour  Intake    428 ml  Output   2500 ml  Net  -2072 ml    Inpatient Medications:  . aspirin EC  81 mg Oral Daily  . ezetimibe  10 mg Oral QHS  . pantoprazole  40 mg Oral Daily  . sodium chloride  3 mL Intravenous Q12H   . sodium chloride Stopped (12/29/13 1000)    Labs:  Recent Labs  12/28/13 0034 12/30/13 0400  NA 139 142  K 4.2 3.2*  CL 106 106  CO2 14* 21  GLUCOSE 193* 105*  BUN 12 13  CREATININE 1.00 0.89  CALCIUM 8.0* 8.1*    Recent Labs  12/28/13 0034 12/29/13 0250  WBC 14.1* 12.5*  HGB 13.6 11.1*  HCT 40.7 33.3*  MCV 95.5 95.1  PLT 194 128*    Recent Labs  12/28/13 0034  TROPONINI 0.95*    Recent Labs  12/28/13 0034  TSH 1.810    Recent Labs  12/28/13 0239  INR 1.28    Radiology/Studies: Dg Chest 2 View  12/26/2013   CLINICAL DATA:  Chest discomfort. Cough. Shortness of breath. Altered mental status.  EXAM: CHEST  2 VIEW  COMPARISON:  None.  FINDINGS: Mild  cardiomegaly and ectasia of the thoracic aorta noted. No evidence of pulmonary edema or other infiltrate. No evidence of pleural effusion. No mass or lymphadenopathy identified.  IMPRESSION: Mild cardiomegaly.  No active lung disease.   Electronically Signed   By: Myles RosenthalJohn  Stahl M.D.   On: 12/26/2013 18:43   Ct Head Wo Contrast  12/26/2013   CLINICAL DATA:  Altered mental status. Hypertension and atrial fibrillation.  EXAM: CT HEAD WITHOUT CONTRAST  TECHNIQUE: Contiguous axial images were obtained from the base of the skull through the vertex without intravenous contrast.  COMPARISON:  None.  FINDINGS: There is no evidence of intracranial hemorrhage, brain edema, or other signs of acute infarction. There is no evidence of intracranial mass lesion or mass effect. No abnormal extraaxial fluid collections are identified.  Mild diffuse cerebral atrophy noted as well as chronic small vessel disease. Ventricles are normal in size. No skull abnormality identified.  IMPRESSION: No acute intracranial abnormality.  Mild diffuse cerebral atrophy and chronic small vessel disease.   Electronically Signed   By: Myles RosenthalJohn  Stahl M.D.   On: 12/26/2013 18:21   Dg Chest Portable 1 View  12/28/2013   CLINICAL DATA:  post implant, R/O pneumothorax  EXAM: PORTABLE CHEST -  1 VIEW  COMPARISON:  DG CHEST 2 VIEW dated 12/26/2013  FINDINGS: The cardiac silhouette is enlarged. Atherosclerotic calcifications appreciated within the aortic arch. There is prominence of the interstitial markings and mild peribronchial cuffing. No focal regions of consolidation or focal infiltrates. No gross osseous abnormalities appreciated. A left-sided dual-chamber cardiac pacing unit is appreciated.  IMPRESSION: Pulmonary vascular congestion/mild edema without focal regions of consolidation.   Electronically Signed   By: Salome HolmesHector  Cooper M.D.   On: 12/28/2013 18:12    Echocardiogram  Study Conclusions - Left ventricle: Difficult study as patient  not cooperative. Flailing arms, pulling at probe. Difficult to get images. Overall LVEF is appears fo be 55 to 60%  with distal inferior hypokinesis. The LV cavity is small.  There is systolic anterior motion of anterior mitral leaflet.  The LVOT is narrow There is turbulent flow through the  LVOT with peak gradient through LVOT of 111 mmHg.  The cavity size was normal. Wall thickness was increased  in a pattern of severe LVH. - Aortic valve: Mild regurgitation. - Left atrium: The atrium was mildly dilated. - Pulmonary arteries: PA peak pressure: 37mm Hg (S).  12-lead ECG on admission - SR with LBBB  Telemetry reviewed in full - V paced at 70 bpm Device interrogaton - performed by industry yesterday - normal PPM function  Assessment and Plan  1. Complete heart block, s/p dual chamber PPM implant  2. History of atrial fibrillation  3. Dementia 4. Hypokalemia PT recommends SNF. Daughter in agreement. Case management and social work following to assist with placement. Will replete potassium.   Signed, Minda MeoBrooke O Edmisten PA-C  EP Attending  Patient seen and examined. Agree with above. Agree with plans as noted. Will transfer to tele. From my perspective she is stable to go to rehab facility. Will plan usual followup for her PPM.  Leonia ReevesGregg Azariyah Luhrs,M.D.

## 2013-12-30 NOTE — Progress Notes (Signed)
FL2 on chart, needs MD signature. Paged PA to inform.   Maryclare LabradorJulie Thedora Rings, MSW, [facility] Clinical Social Worker [phone]

## 2013-12-30 NOTE — Discharge Summary (Signed)
ELECTROPHYSIOLOGY DISCHARGE SUMMARY   Patient ID: Julie Serrano,  MRN: [id number], DOB/AGE: [birth year] 80 y.o.  Admit date: 12/27/2013 Discharge date: 12/30/2013  Primary Care Physician: Julie Scheuermannhomas Parrish, MD Primary Cardiologist: Julie PeaHarris, MD [facility]([place]) Primary EP: Julie Ridgelaylor, MD  Primary Discharge Diagnosis:  1. Complete heart block s/p PPM implantation 2. Acute delirium in setting of known dementia  Secondary Discharge Diagnoses:  1. Atrial fibrillation 2. HTN 3. Dyslipidemia 4. History of esophageal stricture 5. Anxiety 6. Dementia 7. Severe physical decondititioning / frequent falls / unsteady gait - not a candidate for chronic anticoagulation at this time  Procedures This Admission:  1. Transvenous temporary pacing wire placement 12/27/2013 2. 2D echocardiogram 12/28/2013 Study Conclusions - Left ventricle: Difficult study as patient not cooperative. Flailing arms, pulling at probe. Difficult to get images. Overall LVEF is appears fo be 55 to60% with distal inferior hypokinesis. The LV cavity is small There is systolic anterior motion of anterior mitral leaflet. The LVOT is narrow There is turbulent flow through the LVOT with peak gradient through LVOT of 111 mm Hg The cavity size was normal. Wall thickness was increased in a pattern of severe LVH. - Aortic valve: Mild regurgitation. - Left atrium: The atrium was mildly dilated. - Pulmonary arteries: PA peak pressure: 37mm Hg (S). 3. Dual chamber PPM implantation 12/28/2013 RA lead -Medtronic model 5076 45 cm, active fixation pacing lead, serial number [id number] RV lead - Medtronic model 5076, 52 cm, active fixation pacing lead, serial number [id number]  Device - Medtronic dual-chamber pacemaker, serial [id number]  History and Hospital Course:  Julie Serrano is a 80 year old woman with atrial fibrillation, HTN, dyslipidemia and dementia who presented on 12/27/2013 with syncope. During her hospitalization, Julie Serrano  was confused and unable to provide most of her history. Her daughter, with whom she lives, assisted with history. Her daughter reported she had two episodes of syncope in the last week. Both were abrupt. The first occurred on Sat and they took her to [facility]. There she was evaluated with a head CT, chest x-ray and blood work that was unremarkable. She was observed overnight and discharged on Sunday. Sunday evening while sitting watching TV with her family she lost consciousness abruptly and without warning. Her daughter reported "she just slumped over and it took about 5 minutes for her to come around." She had labored breathing with "snoring sounds." She did not complain of CP or dizziness recently but has had intermittent DOE. Prior to admission, she ambulated slowly with rolling walker and has had frequent falls since Feb 2015.   While here she experienced recurrent syncope with documented complete heart block with asystole due to multiple consecutive nonconducted P waves on telemetry, requiring temporary pacing wire placement on 12/27/2013. Potassium 4.2. Troponin mildly elevated 0.95 but felt to represent strain from profound bradycardia causing supply-demand mismatch. TSH normal. ECG on admission shows sinus rhythm with LBBB, QRS duration 216. Echo showed normal LVEF. EP evaluated her on 12/28/2013 and recommended PPM implantation. She underwent dual chamber PPM implantation on 12/28/2013. Julie Serrano tolerated this procedure well without any immediate complication. She remained confused following the procedure but was hemodynamically stable and afebrile. Her chest xray showed stable lead placement without pneumothorax. Her device interrogation shows normal PPM function with stable lead parameters/measurements. Her implant site is intact without significant bleeding or hematoma. PT evaluated Ms. Julie Serrano and recommended SNF placement for rehabilitation. She has been given discharge instructions including  wound care and activity restrictions.  She will follow-up in 10 days for wound check. There were no changes made to her medications except aspirin was discontinued. She has been seen, examined and deemed stable for discharge to SNF today by Dr. Lewayne BuntingGregg Serrano.  Discharge Vitals: Blood pressure 132/42, pulse 65, temperature 98.8 F (37.1 C), temperature source Oral, resp. rate 16, height 5\' 4"  (1.626 m), weight 121 lb 4.1 oz (55 kg), SpO2 94.00%.   Labs: Lab Results  Component Value Date   WBC 12.5* 12/29/2013   HGB 11.1* 12/29/2013   HCT 33.3* 12/29/2013   MCV 95.1 12/29/2013   PLT 128* 12/29/2013    Recent Labs Lab 12/26/13 1740  12/30/13 0400  NA 138  < > 142  K 3.9  < > 3.2*  CL 103  < > 106  CO2 25  < > 21  BUN 10  < > 13  CREATININE 0.98  < > 0.89  CALCIUM 8.4  < > 8.1*  PROT 6.0  --   --   BILITOT 0.3  --   --   ALKPHOS 48  --   --   ALT 25  --   --   AST 33  --   --   GLUCOSE 120*  < > 105*  < > = values in this interval not displayed. Lab Results  Component Value Date   TROPONINI 0.95* 12/28/2013     Recent Labs  12/28/13 0239  INR 1.28    Disposition:  The patient is being discharged in stable condition.  Follow-up:     Follow-up Information   [facility] [address] [facility] On 01/07/2014. (At 12:00 noon for wound check)    Specialty:  Cardiology   Contact information:   [address], [address] [place] [address] [phone]      Follow up with Julie BuntingGregg Taylor, MD On 04/01/2014. (At 1:45 PM)    Specialty:  Cardiology   Contact information:   [address] [place] [address] [phone]      Discharge Medications:    Medication List    STOP taking these medications       aspirin EC 81 MG tablet     diphenhydramine-acetaminophen 25-500 MG Tabs  Commonly known as:  TYLENOL PM     guaiFENesin-dextromethorphan 100-10 MG/5ML syrup  Commonly known as:  ROBITUSSIN DM     mineral oil liquid     MUSCLE  RUB 10-15 % Crea      TAKE these medications       acetaminophen 650 MG CR tablet  Commonly known as:  TYLENOL  Take 650 mg by mouth once as needed for pain.     amiodarone 200 MG tablet  Commonly known as:  PACERONE  Take 1 tablet (200 mg total) by mouth daily.     calcium carbonate 600 MG Tabs tablet  Commonly known as:  OS-CAL  Take 600 mg by mouth at bedtime.     docusate sodium 100 MG capsule  Commonly known as:  COLACE  Take 100 mg by mouth at bedtime.     ezetimibe 10 MG tablet  Commonly known as:  ZETIA  Take 10 mg by mouth at bedtime.     METAMUCIL CLEAR & NATURAL Powd  Take 1 scoop by mouth daily. 1 Teaspoonful daily     mirtazapine 15 MG tablet  Commonly known as:  REMERON  Take 15 mg by mouth at bedtime.  omeprazole 40 MG capsule  Commonly known as:  PRILOSEC  Take 40 mg by mouth every morning.     raloxifene 60 MG tablet  Commonly known as:  EVISTA  Take 60 mg by mouth daily.       Duration of Discharge Encounter: Greater than 30 minutes including physician time.  Signed, Herby Abraham Sairah Knobloch, PA-C 12/30/2013, 1:32 PM

## 2013-12-30 NOTE — Progress Notes (Signed)
Pt discharged to care facility transported per ambulance. Report called to Lavell AnchorsJeanne Weber at care facility. Family took all belongings.

## 2013-12-30 NOTE — Plan of Care (Signed)
Problem: Discharge Progression Outcomes Goal: Other Discharge Outcomes/Goals Outcome: Completed/Met Date Met:  12/30/13 Pt discharged at 1500 per ambulance to rehab center.

## 2013-12-30 NOTE — Progress Notes (Addendum)
Clinical Social Work Department CLINICAL SOCIAL WORK PLACEMENT NOTE 12/30/2013  Patient:  Julie Serrano,Julie Serrano  Account Number:  [id number] Admit date:  12/27/2013  Clinical Social Worker:  Maryclare LabradorJULIE Tyia Binford, Theresia MajorsLCSWA  Date/time:  12/30/2013 10:42 AM  Clinical Social Work is seeking post-discharge placement for this patient at the following level of care:   SKILLED NURSING   (*CSW will update this form in Epic as items are completed)   N/A-sent clinicals to all SNFs [place] [facility] [place] [facility]  Patient/family provided with [facility] Department of Clinical Social Work's list of facilities offering this level of care within the geographic area requested by the patient (or if unable, by the patient's family).  12/30/2013  Patient/family informed of their freedom to choose among providers that offer the needed level of care, that participate in Medicare, Medicaid or managed care program needed by the patient, have an available bed and are willing to accept the patient.  N/A-SNF unable to offer bed Patient/family informed of [facility]' ownership interest in [facility], as well as of the fact that they are under no obligation to receive care at this facility.  PASARR submitted to EDS on 12/30/2013 PASARR number received from EDS on 12/30/2013  FL2 transmitted to all facilities in geographic area requested by pt/family on  12/30/2013 FL2 transmitted to all facilities within larger geographic area on   Patient informed that his/her managed care company has contracts with or will negotiate with  certain facilities, including the following:     Patient/family informed of bed offers received:  12/30/2013 [facility] Physician recommends and patient chooses bed at    [facility] be [facility]  [facility]  The following physician request were entered in Epic:   Additional Comments: Family informed of bed  offer. Discharge packet placed on chart.    Maryclare LabradorJulie Gerrie Castiglia, MSW, [facility] Clinical Social Worker [phone]

## 2013-12-30 NOTE — Progress Notes (Signed)
Clinical Social Work Department BRIEF PSYCHOSOCIAL ASSESSMENT 12/30/2013  Patient:  Julie Serrano,Rochele H     Account Number:  [id number]     Admit date:  12/27/2013  Clinical Social Worker:  Varney BilesANDERSON,Janyce Ellinger, LCSWA  Date/Time:  12/30/2013 10:39 AM  Referred by:  Physician  Date Referred:  12/30/2013 Referred for  SNF Placement   Other Referral:   Interview type:  Family Other interview type:    PSYCHOSOCIAL DATA Living Status:  FAMILY Admitted from facility:   Level of care:   Primary support name:  Julie Serrano [phone]) Primary support relationship to patient:  CHILD, ADULT Degree of support available:   Good--pt lives with son-in-law and daughter.    CURRENT CONCERNS Current Concerns  Post-Acute Placement   Other Concerns:    SOCIAL WORK ASSESSMENT / PLAN PT recommending short-term rehab. Spoke with pt's daughter and pt in room. Daughter Julie Serrano states [facility]. Made referral to this facility with a note stating they are first choice, and called admissions and asked them to review. Facility to inform CSW if they can offer a bed. Referrals also made to all other SNFs [place] as back-up.   Assessment/plan status:  Psychosocial Support/Ongoing Assessment of Needs Other assessment/ plan:   Information/referral to community resources:   [facility]?    PATIENT'S/FAMILY'S RESPONSE TO PLAN OF CARE: Good--daughter understanding of CSW role in discharge and thanked CSW for assistance.       Julie Serrano, MSW, [facility] Clinical Social Worker [phone]

## 2013-12-30 NOTE — Progress Notes (Signed)
RN called re: low K+ Ordered K+ 40 meq x 1, BMET in am

## 2013-12-30 NOTE — Progress Notes (Signed)
Pt placed on bedpan- unable to void but had small BM. Bladder scan completed with 68cc of urine noted. Enc. pt to take po fluids. Family at bedside and aware.

## 2013-12-31 ENCOUNTER — Telehealth: Payer: Self-pay | Admitting: Internal Medicine

## 2013-12-31 NOTE — Telephone Encounter (Signed)
Follow Up:  Still waiting to find out if dressing needs to be changed

## 2013-12-31 NOTE — Telephone Encounter (Signed)
Spoke with Julie Serrano  ([facility]) and instructed him to take the dressing off but leave steri strips intact.

## 2013-12-31 NOTE — Telephone Encounter (Signed)
New problem   Need to know if pt need to change the pacer dressing or do they leave it for the 30th appt. Please advise.

## 2014-01-01 ENCOUNTER — Encounter: Payer: Self-pay | Admitting: Internal Medicine

## 2014-01-07 ENCOUNTER — Ambulatory Visit (INDEPENDENT_AMBULATORY_CARE_PROVIDER_SITE_OTHER): Payer: Medicare Other | Admitting: *Deleted

## 2014-01-07 DIAGNOSIS — R55 Syncope and collapse: Secondary | ICD-10-CM

## 2014-01-07 DIAGNOSIS — I442 Atrioventricular block, complete: Secondary | ICD-10-CM

## 2014-01-07 DIAGNOSIS — I447 Left bundle-branch block, unspecified: Secondary | ICD-10-CM

## 2014-01-07 LAB — MDC_IDC_ENUM_SESS_TYPE_INCLINIC
Battery Impedance: 100 Ohm
Battery Remaining Longevity: 125 mo
Brady Statistic AP VP Percent: 1 %
Brady Statistic AP VS Percent: 0 %
Brady Statistic AS VP Percent: 99 %
Brady Statistic AS VS Percent: 0 %
Lead Channel Impedance Value: 618 Ohm
Lead Channel Pacing Threshold Amplitude: 0.5 V
Lead Channel Pacing Threshold Pulse Width: 0.4 ms
Lead Channel Sensing Intrinsic Amplitude: 11.2 mV
Lead Channel Sensing Intrinsic Amplitude: 4 mV
MDC IDC MSMT BATTERY VOLTAGE: 2.78 V
MDC IDC MSMT LEADCHNL RA IMPEDANCE VALUE: 522 Ohm
MDC IDC MSMT LEADCHNL RV PACING THRESHOLD AMPLITUDE: 0.75 V
MDC IDC MSMT LEADCHNL RV PACING THRESHOLD PULSEWIDTH: 0.4 ms
MDC IDC SESS DTM: 20150430160135
MDC IDC SET LEADCHNL RA PACING AMPLITUDE: 3.5 V
MDC IDC SET LEADCHNL RV PACING AMPLITUDE: 3.5 V
MDC IDC SET LEADCHNL RV PACING PULSEWIDTH: 0.4 ms
MDC IDC SET LEADCHNL RV SENSING SENSITIVITY: 4 mV

## 2014-01-11 NOTE — Progress Notes (Signed)
Wound check appointment. Steri-strips removed. Wound without redness or edema. Incision edges approximated, wound well healed. Stitch removed from L corner of incision site. Normal device function. Thresholds, sensing, and impedances consistent with implant measurements. Device programmed at 3.5V for extra safety margin until 3 month visit. Histogram distribution appropriate for patient and level of activity. 3 mode switches(0.5%)---2 AHR episodes---max dur. 56 mins, Max A 206, Max V 120---AFL--no anticoagulant---fall risk. No high ventricular rates noted. Patient educated about wound care, arm mobility, lifting restrictions. ROV in 3 months with GT.

## 2014-01-20 ENCOUNTER — Telehealth: Payer: Self-pay | Admitting: Internal Medicine

## 2014-01-20 NOTE — Telephone Encounter (Signed)
New problem ° ° °Pt is having chest pain. °

## 2014-01-20 NOTE — Telephone Encounter (Signed)
Julie PoagJeanne from [facility] facility called [phone]) to report that the patient has been experiencing sharp pains to the pacer site in her chest.  This occurs sporadically and has been happening weekly and typically last an hour. The patient has her next appt scheduled for 04/01/14 but they are requesting a sooner appointment. Denies any other symptoms or problems or radiating pain/discomfort. Appointment rescheduled for 01/21/14 @ 2:00 pm. Nelly Routalled Julie Serrano back and advised her of the rescheduled appointment time and she said she would coordinate this for the patient to have transportation.

## 2014-01-25 ENCOUNTER — Encounter: Payer: Self-pay | Admitting: Internal Medicine

## 2014-03-09 ENCOUNTER — Encounter: Payer: Self-pay | Admitting: Internal Medicine

## 2014-03-09 ENCOUNTER — Ambulatory Visit (INDEPENDENT_AMBULATORY_CARE_PROVIDER_SITE_OTHER): Payer: Medicaid Other | Admitting: Internal Medicine

## 2014-03-09 VITALS — BP 104/64 | HR 76 | Ht 66.0 in | Wt 115.0 lb

## 2014-03-09 DIAGNOSIS — I442 Atrioventricular block, complete: Secondary | ICD-10-CM

## 2014-03-09 DIAGNOSIS — I10 Essential (primary) hypertension: Secondary | ICD-10-CM

## 2014-03-09 DIAGNOSIS — I4891 Unspecified atrial fibrillation: Secondary | ICD-10-CM

## 2014-03-09 DIAGNOSIS — I480 Paroxysmal atrial fibrillation: Secondary | ICD-10-CM

## 2014-03-09 NOTE — Progress Notes (Signed)
PCP: PARRISH, Marya LandryHOMAS E, MD Primary EP:  Dr Alfonse Alpersaylor  Julie Serrano is a 80 y.o. female who presents today for routine electrophysiology followup.  Since having her pacemaker implanted 4/15 by Dr Ladona Ridgelaylor, the patient reports doing very well.  He has occasional postural dizziness.  Today, she denies symptoms of palpitations, chest pain, shortness of breath,  lower extremity edema, presyncope, or syncope.  The patient is otherwise without complaint today.   Past Medical History  Diagnosis Date  . Anxiety and depression   . Arthritis   . Atrial fibrillation     a. amiodarone  . Hypertension   . Hyperlipemia   . Diverticulitis   . Esophageal stricture   . Complete heart block     s/p dual chamber PPM implantation April 2015   Past Surgical History  Procedure Laterality Date  . Appendectomy    . Cholecystectomy    . Abdominal hysterectomy    . Pacemaker insertion  12-28-2013    MDT dual chamber pacemaker implanted by Dr Ladona Ridgelaylor for CHB    Current Outpatient Prescriptions  Medication Sig Dispense Refill  . acetaminophen (TYLENOL) 650 MG CR tablet Take 650 mg by mouth once as needed for pain.      Marland Kitchen. amiodarone (PACERONE) 200 MG tablet Take 1 tablet (200 mg total) by mouth daily.      . calcium carbonate (OS-CAL) 600 MG TABS tablet Take 600 mg by mouth at bedtime.       . docusate sodium (COLACE) 100 MG capsule Take 100 mg by mouth at bedtime.      Marland Kitchen. ezetimibe (ZETIA) 10 MG tablet Take 10 mg by mouth at bedtime.       . Inulin (METAMUCIL CLEAR & NATURAL) POWD Take 1 scoop by mouth daily. 1 Teaspoonful daily      . mirtazapine (REMERON) 15 MG tablet Take 15 mg by mouth at bedtime.      Marland Kitchen. omeprazole (PRILOSEC) 40 MG capsule Take 40 mg by mouth every morning.       . raloxifene (EVISTA) 60 MG tablet Take 60 mg by mouth daily.       No current facility-administered medications for this visit.    Physical Exam: Filed Vitals:   03/09/14 1043  BP: 104/64  Pulse: 76  Height: 5\' 6"  (1.676 m)   Weight: 115 lb (52.164 kg)    GEN- The patient is elderly but pleasant appearing, alert and oriented x 3 today.   Head- normocephalic, atraumatic Eyes-  Sclera clear, conjunctiva pink Ears- hearing intact Oropharynx- clear Lungs- Clear to ausculation bilaterally, normal work of breathing Chest- pacemaker pocket is well healed Heart- Regular rate and rhythm, 3/6 SEM LSB GI- soft, NT, ND, + BS Extremities- no clubbing, cyanosis, or edema  Pacemaker interrogation- reviewed in detail today,  See PACEART report  Assessment and Plan:  1. Complete heart block Normal pacemaker function See Pace Art report No changes today  2. Atrial fibrillation Controlled with amiodarone She is on coumadin but her family is contemplating stopping this Given advanced age, she is high risk for anticoagulation and falls, particularly with postural dizziness  3. Postural dizziness Stable Adequate hydration is encouarged  Return to see Norma FredricksonLori Gerhardt in 3 months [facility] Return to see Dr Ladona Ridgelaylor in 12 months in the device clinic

## 2014-03-09 NOTE — Patient Instructions (Signed)
    Your physician recommends that you schedule a follow-up appointment in: 3 months with Norma FredricksonLori Gerhardt, NP/device clinic   Your physician wants you to follow-up in: 12 months with Dr Court Joyaylor You will receive a reminder letter in the mail two months in advance. If you don't receive a letter, please call our office to schedule the follow-up appointment.

## 2014-03-15 LAB — MDC_IDC_ENUM_SESS_TYPE_INCLINIC
Battery Impedance: 100 Ohm
Battery Voltage: 2.78 V
Brady Statistic AP VP Percent: 5.8 %
Brady Statistic AP VS Percent: 0.1 % — CL
Lead Channel Impedance Value: 530 Ohm
Lead Channel Pacing Threshold Amplitude: 0.5 V
Lead Channel Pacing Threshold Amplitude: 0.5 V
Lead Channel Pacing Threshold Pulse Width: 0.4 ms
Lead Channel Setting Pacing Amplitude: 3.5 V
Lead Channel Setting Pacing Amplitude: 3.5 V
Lead Channel Setting Pacing Pulse Width: 0.4 ms
MDC IDC MSMT LEADCHNL RA PACING THRESHOLD PULSEWIDTH: 0.4 ms
MDC IDC MSMT LEADCHNL RA SENSING INTR AMPL: 4 mV
MDC IDC MSMT LEADCHNL RV IMPEDANCE VALUE: 636 Ohm
MDC IDC MSMT LEADCHNL RV SENSING INTR AMPL: 11.2 mV
MDC IDC SET LEADCHNL RV SENSING SENSITIVITY: 4 mV
MDC IDC STAT BRADY AS VP PERCENT: 94.1 %
MDC IDC STAT BRADY AS VS PERCENT: 0.1 % — AB

## 2014-04-01 ENCOUNTER — Encounter: Payer: Medicare Other | Admitting: Internal Medicine

## 2014-06-08 ENCOUNTER — Encounter: Payer: Self-pay | Admitting: Nurse Practitioner

## 2014-06-08 ENCOUNTER — Ambulatory Visit (INDEPENDENT_AMBULATORY_CARE_PROVIDER_SITE_OTHER): Payer: Medicare Other | Admitting: *Deleted

## 2014-06-08 ENCOUNTER — Ambulatory Visit (INDEPENDENT_AMBULATORY_CARE_PROVIDER_SITE_OTHER): Payer: Medicare Other | Admitting: Nurse Practitioner

## 2014-06-08 VITALS — BP 130/64 | HR 60 | Ht 66.0 in | Wt 123.4 lb

## 2014-06-08 DIAGNOSIS — I442 Atrioventricular block, complete: Secondary | ICD-10-CM

## 2014-06-08 DIAGNOSIS — I480 Paroxysmal atrial fibrillation: Secondary | ICD-10-CM

## 2014-06-08 DIAGNOSIS — I4891 Unspecified atrial fibrillation: Secondary | ICD-10-CM

## 2014-06-08 DIAGNOSIS — Z79899 Other long term (current) drug therapy: Secondary | ICD-10-CM

## 2014-06-08 LAB — HEPATIC FUNCTION PANEL
ALT: 20 U/L (ref 0–35)
AST: 25 U/L (ref 0–37)
Albumin: 3.7 g/dL (ref 3.5–5.2)
Alkaline Phosphatase: 47 U/L (ref 39–117)
Bilirubin, Direct: 0 mg/dL (ref 0.0–0.3)
Total Bilirubin: 0.5 mg/dL (ref 0.2–1.2)
Total Protein: 6.5 g/dL (ref 6.0–8.3)

## 2014-06-08 LAB — BASIC METABOLIC PANEL
BUN: 21 mg/dL (ref 6–23)
CO2: 29 mEq/L (ref 19–32)
Calcium: 9 mg/dL (ref 8.4–10.5)
Chloride: 105 mEq/L (ref 96–112)
Creatinine, Ser: 1.1 mg/dL (ref 0.4–1.2)
GFR: 50.4 mL/min — ABNORMAL LOW (ref >60.00)
Glucose, Bld: 90 mg/dL (ref 70–99)
Potassium: 4.8 mEq/L (ref 3.5–5.1)
Sodium: 136 mEq/L (ref 135–145)

## 2014-06-08 LAB — CBC
HCT: 38.7 % (ref 36.0–46.0)
Hemoglobin: 12.8 g/dL (ref 12.0–15.0)
MCHC: 33.1 g/dL (ref 30.0–36.0)
MCV: 94.1 fl (ref 78.0–100.0)
Platelets: 191 10*3/uL (ref 150.0–400.0)
RBC: 4.11 Mil/uL (ref 3.87–5.11)
RDW: 15 % (ref 11.5–15.5)
WBC: 7.5 10*3/uL (ref 4.0–10.5)

## 2014-06-08 LAB — MDC_IDC_ENUM_SESS_TYPE_INCLINIC
Battery Impedance: 100 Ohm
Brady Statistic AP VP Percent: 18 %
Brady Statistic AP VS Percent: 0 %
Brady Statistic AS VS Percent: 0 %
Date Time Interrogation Session: 20150929114733
Lead Channel Impedance Value: 492 Ohm
Lead Channel Impedance Value: 562 Ohm
Lead Channel Pacing Threshold Pulse Width: 0.4 ms
Lead Channel Sensing Intrinsic Amplitude: 4 mV
Lead Channel Setting Pacing Amplitude: 2 V
Lead Channel Setting Pacing Amplitude: 2.5 V
Lead Channel Setting Sensing Sensitivity: 4 mV
MDC IDC MSMT BATTERY REMAINING LONGEVITY: 138 mo
MDC IDC MSMT BATTERY VOLTAGE: 2.78 V
MDC IDC MSMT LEADCHNL RA PACING THRESHOLD AMPLITUDE: 0.5 V
MDC IDC MSMT LEADCHNL RV PACING THRESHOLD AMPLITUDE: 0.5 V
MDC IDC MSMT LEADCHNL RV PACING THRESHOLD PULSEWIDTH: 0.4 ms
MDC IDC MSMT LEADCHNL RV SENSING INTR AMPL: 5.6 mV
MDC IDC SET LEADCHNL RV PACING PULSEWIDTH: 0.4 ms
MDC IDC STAT BRADY AS VP PERCENT: 82 %

## 2014-06-08 LAB — TSH: TSH: 1.2 u[IU]/mL (ref 0.35–4.50)

## 2014-06-08 NOTE — Progress Notes (Signed)
Pacemaker check in clinic. Normal device function. Thresholds, sensing, impedances consistent with previous measurements. Device programmed to maximize longevity. No mode switch or high ventricular rates noted. Device programmed at appropriate safety margins. Histogram distribution appropriate for patient activity level. Device programmed to optimize intrinsic conduction. Estimated longevity 11.5 years. Patient enrolled in remote follow-up. [facility] 09-09-14 and ROV in April with GT.

## 2014-06-08 NOTE — Progress Notes (Signed)
Julie PuntIrma H Serrano Date of Birth: 12-30-1924 Medical Record #[id number]  History of Present Illness: Ms. Julie Serrano is seen back today for a 3 month check. Seen for Dr. Ladona Ridgelaylor. [facility] resides at [facility]. She has PAF, on amiodarone. Has PPM in place for CHB. On chronic coumadin. Other issues as noted below.   Last seen here in June. Noted that family was thinking of stopping her coumadin. She is unsteady. She was dizzy.  Comes in today. Here with her daughter Julie Serrano. Says everything is fine. No falls. She is pretty much wheelchair bound. Sits all day with her legs down. Sounds like her coumadin has been going ok. Family wishes to continue. The patient says she is fine with no issues. Some swelling in her legs - probably gets too much salt - says the facility will be doing an ultrasound to rule out DVT.   Current Outpatient Prescriptions  Medication Sig Dispense Refill  . acetaminophen (TYLENOL) 650 MG CR tablet Take 650 mg by mouth once as needed for pain.      Marland Kitchen. ALPRAZolam (XANAX) 0.25 MG tablet Take 0.25 mg by mouth every 8 (eight) hours as needed for anxiety.      Marland Kitchen. amiodarone (PACERONE) 200 MG tablet Take 1 tablet (200 mg total) by mouth daily.      . calcium carbonate (OS-CAL) 600 MG TABS tablet Take 600 mg by mouth at bedtime.       . docusate sodium (COLACE) 100 MG capsule Take 100 mg by mouth at bedtime.      Marland Kitchen. escitalopram (LEXAPRO) 10 MG tablet Take 10 mg by mouth daily.      Marland Kitchen. ezetimibe (ZETIA) 10 MG tablet Take 10 mg by mouth at bedtime.       . Inulin (METAMUCIL CLEAR & NATURAL) POWD Take 1 scoop by mouth daily. 1 Teaspoonful daily      . lactose free nutrition (BOOST) LIQD Take 237 mLs by mouth 3 (three) times daily between meals.      . latanoprost (XALATAN) 0.005 % ophthalmic solution Place 1 drop into both eyes at bedtime.      Marland Kitchen. loratadine (CLARITIN) 10 MG tablet Take 10 mg by mouth daily.      . Melatonin 3 MG TABS Take 1 tablet by mouth daily as needed.      . mirtazapine  (REMERON) 15 MG tablet Take 7.5 mg by mouth at bedtime.       . pantoprazole (PROTONIX) 40 MG tablet Take 40 mg by mouth 2 (two) times daily.      . sodium chloride (OCEAN) 0.65 % SOLN nasal spray Place 1 spray into both nostrils as needed for congestion.      Marland Kitchen. warfarin (COUMADIN) 2 MG tablet Take 2.5 mg by mouth daily.       No current facility-administered medications for this visit.    Allergies  Allergen Reactions  . Ativan [Lorazepam] Other (See Comments)    Extreme delirium  . Sulfa Antibiotics     unknown    Past Medical History  Diagnosis Date  . Anxiety and depression   . Arthritis   . Atrial fibrillation     a. amiodarone  . Hypertension   . Hyperlipemia   . Diverticulitis   . Esophageal stricture   . Complete heart block     s/p dual chamber PPM implantation April 2015    Past Surgical History  Procedure Laterality Date  . Appendectomy    . Cholecystectomy    .  Abdominal hysterectomy    . Pacemaker insertion  12-28-2013    MDT dual chamber pacemaker implanted by Dr Ladona Ridgel for CHB    History  Smoking status  . Never Smoker   Smokeless tobacco  . Never Used    History  Alcohol Use  . Yes    History reviewed. No pertinent family history.  Review of Systems: The review of systems is per the HPI.  All other systems were reviewed and are negative.  Physical Exam: BP 130/64  Pulse 60  Ht 5\' 6"  (1.676 m)  Wt 123 lb 6.4 oz (55.974 kg)  BMI 19.93 kg/m2 Patient is very pleasant and in no acute distress. Skin is warm and dry. Color is normal.  HEENT is unremarkable. Normocephalic/atraumatic. PERRL. Sclera are nonicteric. Neck is supple. No masses. No JVD. Lungs are clear. Cardiac exam shows a regular rate and rhythm. Outflow murmur noted. Abdomen is soft. Extremities are with trace edema. Gait not tested. She is in a wheelchair.  No gross neurologic deficits noted.  Wt Readings from Last 3 Encounters:  06/08/14 123 lb 6.4 oz (55.974 kg)  03/09/14 115  lb (52.164 kg)  12/27/13 121 lb 4.1 oz (55 kg)    LABORATORY DATA/PROCEDURES:  Lab Results  Component Value Date   WBC 12.5* 12/29/2013   HGB 11.1* 12/29/2013   HCT 33.3* 12/29/2013   PLT 128* 12/29/2013   GLUCOSE 105* 12/30/2013   ALT 25 12/26/2013   AST 33 12/26/2013   NA 142 12/30/2013   K 3.2* 12/30/2013   CL 106 12/30/2013   CREATININE 0.89 12/30/2013   BUN 13 12/30/2013   CO2 21 12/30/2013   TSH 1.810 12/28/2013   INR 1.28 12/28/2013    BNP (last 3 results) No results found for this basename: PROBNP,  in the last 8760 hours   Assessment / Plan: 1. PAF - on amiodarone - needs surveillance labs  2. HTN -  BP ok today.   3. Chronic coumadin - monitored by the facility -  Some bruising but no recent falls.   4. PPM - followed by Dr. Ladona Ridgel -  Checking device today.   Family wishes to keep her on her current regimen, including the coumadin. She remains in a controlled environment and sounds like there are no plans for her to return home. I have left her on her current regimen. Check labs today. See back in April or if she has changes in her status.  Patient is agreeable to this plan and will call if any problems develop in the interim.   Rosalio Macadamia, RN, ANP-C [facility] [address] [place], [place]  [address] [phone]

## 2014-06-08 NOTE — Patient Instructions (Signed)
We will be checking the following labs today BMET, CBC, HPF, and TSH.  Stay on your current medicines  See Dr. Ladona Ridgelaylor in April  Call the [facility] office at [phone] if you have any questions, problems or concerns.

## 2014-06-17 ENCOUNTER — Encounter: Payer: Self-pay | Admitting: Internal Medicine

## 2014-08-19 ENCOUNTER — Encounter (HOSPITAL_COMMUNITY): Payer: Self-pay | Admitting: Interventional Cardiology

## 2014-08-30 ENCOUNTER — Non-Acute Institutional Stay (SKILLED_NURSING_FACILITY): Payer: Medicare Other | Admitting: Internal Medicine

## 2014-08-30 DIAGNOSIS — F0280 Dementia in other diseases classified elsewhere without behavioral disturbance: Secondary | ICD-10-CM

## 2014-08-30 DIAGNOSIS — I119 Hypertensive heart disease without heart failure: Secondary | ICD-10-CM

## 2014-08-30 DIAGNOSIS — I482 Chronic atrial fibrillation, unspecified: Secondary | ICD-10-CM

## 2014-08-30 DIAGNOSIS — Z950 Presence of cardiac pacemaker: Secondary | ICD-10-CM

## 2014-08-30 DIAGNOSIS — G309 Alzheimer's disease, unspecified: Secondary | ICD-10-CM

## 2014-09-06 NOTE — Progress Notes (Signed)
Patient ID: Julie Serrano, female   DOB: 1924-11-11, 80 y.o.   MRN: [id number]               HISTORY & PHYSICAL  DATE:  08/30/2014        FACILITY: [facility]    LEVEL OF CARE:   SNF   CHIEF COMPLAINT:  Transfer to our service from Dr. Virgina OrganQureshi.  The patient is also cared for by [facility].    HISTORY OF PRESENT ILLNESS:  This is a patient whom I believe has been in the facility since April of this year.  At that point, she had a pacemaker insertion for complete heart block.    She is a pleasant lady who has really had no recent issues in the facility.    Today, her INR was noted to be elevated on the CoaguChek and the phlebotomy is pending, although she has no bleeding.    PAST MEDICAL HISTORY/PROBLEM LIST:           Anxiety and depression.    Arthritis.    Atrial fibrillation.    Complete heart block.  Status post permanent pacemaker in April 2015.    Hypertension.    Hyperlipidemia.    Diverticulosis/diverticulitis.     History of esophageal stricture.    PAST SURGICAL HISTORY:                     Appendectomy.    Cholecystectomy.    Abdominal hysterectomy.    Pacemaker insertion.    CURRENT MEDICATIONS:  Medication list is reviewed.                 Amiodarone 200 mg daily.    Protonix 40 b.i.d.    Colace 100 b.i.d.    Saline nasal spray four times a day.    Calcium carbonate 600 mg at bedtime.    Zetia 10 mg q.h.s.    Remeron 15 mg q.h.s.    Melatonin 3 mg at bedtime, scheduled.    Vitamin B12, 1000 intramuscularly monthly.    Claritin 10 q.d.    Coumadin 2 mg daily.    Xalatan ophthalmic 1 drop into each eye at bedtime.    SOCIAL HISTORY:                        TOBACCO USE:  The patient is a never-smoker.     CODE STATUS:  The patient is a Full Code.    FAMILY HISTORY:                 MOTHER/FATHER:  Both parents are deceased.  No relevant history here.    REVIEW OF SYSTEMS:   HEENT:  No headache.   CHEST/RESPIRATORY:  No shortness  of breath.  CARDIAC:   No chest pain or palpitations.    GI:   No rectal bleeding.   GU:  No dysuria or hematuria.    PHYSICAL EXAMINATION:   GENERAL APPEARANCE:  Pleasant woman in no distress.   CHEST/RESPIRATORY:  Shallow, but otherwise clear air entry.   CARDIOVASCULAR:  CARDIAC:  Heart sounds are actually regular.  She has a 3/6 midsystolic murmur which is compatible with aortic sclerosis.  JVP is not elevated.   GASTROINTESTINAL:  ABDOMEN:   Soft, nontender.  No masses are noted.   PSYCHIATRIC:   MENTAL STATUS:   Clearly some issues with memory loss.  In general conversation, she does not remember when she came  in here and not where she was living before she came here.  Scored 20/30 on the Folstein.    ASSESSMENT/PLAN:                         Cardiac issues which include atrial fibrillation, complete heart block, status post pacemaker, severe LVH on echocardiogram earlier this year.  She is on chronic Coumadin.    Mild to moderate dementia.    History of esophageal stricture.  Does not seem to be a problem here currently.    Awaiting adjustment of current Coumadin dose.  It appears that she was on Coumadin 4 mg once daily from 08/19/2014.    Hypertensive heart disease with severe LVH on most recent echo.  Blood pressures appear to be recently well controlled.

## 2014-09-09 ENCOUNTER — Encounter: Payer: Medicare Other | Admitting: *Deleted

## 2014-09-09 ENCOUNTER — Telehealth: Payer: Self-pay | Admitting: Cardiology

## 2014-09-09 NOTE — Telephone Encounter (Signed)
LMOVM reminding pt to send remote transmission.   

## 2014-09-13 ENCOUNTER — Telehealth: Payer: Self-pay | Admitting: Internal Medicine

## 2014-09-13 ENCOUNTER — Telehealth: Payer: Self-pay | Admitting: Cardiology

## 2014-09-13 NOTE — Telephone Encounter (Signed)
Spoke w/ Julie Serrano at [facility] she will call before she sends transmission either Tuesday or Friday to receive instructions on how to send transmission.

## 2014-09-13 NOTE — Telephone Encounter (Signed)
Spoke w/ pt daughter and informed her that we have not received transmission. Informed her to have nursing home contact me and I will instruct them how to set monitor up and how to send transmission. She verbalized understanding.

## 2014-09-13 NOTE — Telephone Encounter (Signed)
New Message  Pt daughter was told to resch remote pacer check.  Alice from [facility] called to arrange Please call b ack and discuss.

## 2014-09-14 ENCOUNTER — Encounter: Payer: Self-pay | Admitting: Cardiology

## 2014-09-17 ENCOUNTER — Telehealth: Payer: Self-pay | Admitting: Internal Medicine

## 2014-09-17 ENCOUNTER — Ambulatory Visit (INDEPENDENT_AMBULATORY_CARE_PROVIDER_SITE_OTHER): Payer: Medicare Other | Admitting: *Deleted

## 2014-09-17 DIAGNOSIS — I442 Atrioventricular block, complete: Secondary | ICD-10-CM

## 2014-09-17 LAB — MDC_IDC_ENUM_SESS_TYPE_REMOTE
Battery Impedance: 100 Ohm
Battery Remaining Longevity: 137 mo
Brady Statistic AP VS Percent: 0 %
Brady Statistic AS VP Percent: 73 %
Brady Statistic AS VS Percent: 0 %
Lead Channel Impedance Value: 560 Ohm
Lead Channel Pacing Threshold Amplitude: 0.625 V
Lead Channel Pacing Threshold Amplitude: 0.625 V
Lead Channel Pacing Threshold Pulse Width: 0.4 ms
Lead Channel Sensing Intrinsic Amplitude: 1.4 mV
Lead Channel Setting Pacing Pulse Width: 0.4 ms
MDC IDC MSMT BATTERY VOLTAGE: 2.78 V
MDC IDC MSMT LEADCHNL RA IMPEDANCE VALUE: 472 Ohm
MDC IDC MSMT LEADCHNL RA PACING THRESHOLD PULSEWIDTH: 0.4 ms
MDC IDC SESS DTM: 20160108155852
MDC IDC SET LEADCHNL RA PACING AMPLITUDE: 2 V
MDC IDC SET LEADCHNL RV PACING AMPLITUDE: 2.5 V
MDC IDC SET LEADCHNL RV SENSING SENSITIVITY: 4 mV
MDC IDC STAT BRADY AP VP PERCENT: 27 %

## 2014-09-17 NOTE — Progress Notes (Signed)
Remote pacemaker transmission.   

## 2014-09-17 NOTE — Telephone Encounter (Signed)
Spoke w/ pt nurse and instructed her how to send manual transmission.

## 2014-09-17 NOTE — Telephone Encounter (Signed)
New message      Need someone to call and help with the remote transmission check

## 2014-10-04 ENCOUNTER — Non-Acute Institutional Stay (SKILLED_NURSING_FACILITY): Payer: Medicare Other | Admitting: Internal Medicine

## 2014-10-04 DIAGNOSIS — I495 Sick sinus syndrome: Secondary | ICD-10-CM

## 2014-10-04 DIAGNOSIS — G309 Alzheimer's disease, unspecified: Secondary | ICD-10-CM

## 2014-10-04 DIAGNOSIS — F0280 Dementia in other diseases classified elsewhere without behavioral disturbance: Secondary | ICD-10-CM

## 2014-10-04 DIAGNOSIS — I119 Hypertensive heart disease without heart failure: Secondary | ICD-10-CM

## 2014-10-07 ENCOUNTER — Encounter: Payer: Self-pay | Admitting: Cardiology

## 2014-10-07 NOTE — Progress Notes (Signed)
Patient ID: Julie Serrano, female   DOB: 09-May-1925, 81 y.o.   MRN: [id number]              PROGRESS NOTE  DATE:  10/04/2014                     FACILITY: [facility]     LEVEL OF CARE:   SNF   Routine Visit   CHIEF COMPLAINT:  Review of medical issues/[facility] visit.    HISTORY OF PRESENT ILLNESS:  This is a patient whose care I assumed last month.    She was admitted to this facility in April 2015.  At that point, she had had a pacemaker insertion for complete heart block.  She is on chronic Coumadin.    She has not had any major issues in the facility.    PAST MEDICAL HISTORY/PROBLEM LIST:                   Depression with anxiety.    Mild to moderate dementia.    Complete heart block.  Status post pacemaker in 2015.  Follows with Dr. Ladona Ridgelaylor of Cardiology.    Hypertension.    Hyperlipidemia.    Diverticulosis/diverticulitis.    History of an esophageal stricture.    PAST SURGICAL HISTORY:            Appendectomy.    Cholecystectomy.          Abdominal hysterectomy.     Pacemaker insertion.    CURRENT MEDICATIONS:   Medication list is reviewed.     Amiodarone 200 mg daily.    Lexapro 10 q.d.      Protonix 40 b.i.d., for severe GERD.    Colace 100 b.i.d.     Calcium carbonate 600 q.h.s.      Zetia 10 mg at bedtime.    Melatonin 3 mg at bedtime.    Remeron 7.5 q.d.      Xalatan ophthalmic for glaucoma.    Vitamin B12, 1000 mcg every month.     Vitamin D3, 50,000 U monthly.    Claritin 10 mg a day.    Coumadin 3 mg daily.  Last INR was 1.6 on 2.5 mg.  Changed to 3 mg.  A follow-up was supposed to be done on 09/29/2014, although I do not see this result.  It would appear that this was 4.4, at which time her Coumadin was put on hold and restarted at 2.5.     SOCIAL HISTORY:   ADVANCED DIRECTIVES:  The patient is listed as a Full Code.    REVIEW OF SYSTEMS:     CHEST/RESPIRATORY:  No shortness of breath.  CARDIAC:   No chest pain.    GI:  No  abdominal pain.  No rectal bleeding.   GU:  No dysuria.             PHYSICAL EXAMINATION:   VITAL SIGNS:   BLOOD PRESSURE:  120/68.   PULSE:  64.   RESPIRATIONS:  18.   GENERAL APPEARANCE:  The patient is not in any distress.    CHEST/RESPIRATORY:  Exam is clear.    CARDIOVASCULAR:  CARDIAC:   Heart sounds are normal.  Pacemaker in place.   GASTROINTESTINAL:  ABDOMEN:   Soft.   LIVER/SPLEEN/KIDNEYS:  No liver, no spleen.    ASSESSMENT/PLAN:                      Sick sinus syndrome.  Status post pacemaker with severe LVH.  It would appear that her blood pressure is under good control.  She is on chronic Coumadin, although there have been recent issues with getting that into the therapeutic range.    Mild to moderate dementia.  She might benefit from Aricept or a similar medication.    History of esophageal stricture.  Is on high-dose PPIs.  Does not seem to be problematic.    Hypertensive heart disease with severe LVH.     CPT CODE: 40981

## 2014-10-14 ENCOUNTER — Encounter: Payer: Self-pay | Admitting: Internal Medicine

## 2014-10-18 ENCOUNTER — Other Ambulatory Visit (HOSPITAL_COMMUNITY)
Admission: RE | Admit: 2014-10-18 | Discharge: 2014-10-18 | Disposition: A | Discharge status: Home / Self Care | Payer: Medicare Other | Source: Ambulatory Visit | Attending: Internal Medicine | Admitting: Internal Medicine

## 2014-10-18 DIAGNOSIS — I10 Essential (primary) hypertension: Secondary | ICD-10-CM | POA: Diagnosis not present

## 2014-10-18 DIAGNOSIS — I4891 Unspecified atrial fibrillation: Secondary | ICD-10-CM | POA: Diagnosis present

## 2014-10-18 LAB — BASIC METABOLIC PANEL
ANION GAP: 4 — AB (ref 5–15)
BUN: 12 mg/dL (ref 6–23)
CALCIUM: 8.5 mg/dL (ref 8.4–10.5)
CHLORIDE: 107 mmol/L (ref 96–112)
CO2: 23 mmol/L (ref 19–32)
CREATININE: 1.05 mg/dL (ref 0.50–1.10)
GFR calc Af Amer: 54 mL/min — ABNORMAL LOW (ref >90)
GFR calc non Af Amer: 46 mL/min — ABNORMAL LOW (ref >90)
Glucose, Bld: 121 mg/dL — ABNORMAL HIGH (ref 70–99)
Potassium: 4.1 mmol/L (ref 3.5–5.1)
Sodium: 134 mmol/L — ABNORMAL LOW (ref 135–145)

## 2014-10-18 LAB — CBC
HEMATOCRIT: 37 % (ref 36.0–46.0)
HEMOGLOBIN: 12 g/dL (ref 12.0–15.0)
MCH: 31.2 pg (ref 26.0–34.0)
MCHC: 32.4 g/dL (ref 30.0–36.0)
MCV: 96.1 fL (ref 78.0–100.0)
PLATELETS: 193 10*3/uL (ref 150–400)
RBC: 3.85 MIL/uL — ABNORMAL LOW (ref 3.87–5.11)
RDW: 13.4 % (ref 11.5–15.5)
WBC: 5.9 10*3/uL (ref 4.0–10.5)

## 2014-10-20 ENCOUNTER — Encounter: Payer: Self-pay | Admitting: Internal Medicine

## 2014-10-21 ENCOUNTER — Encounter: Payer: Self-pay | Admitting: Cardiology

## 2014-10-21 NOTE — Progress Notes (Signed)
This encounter was created in error - please disregard.

## 2014-11-06 ENCOUNTER — Non-Acute Institutional Stay (SKILLED_NURSING_FACILITY): Payer: Medicare Other | Admitting: Internal Medicine

## 2014-11-06 DIAGNOSIS — G309 Alzheimer's disease, unspecified: Secondary | ICD-10-CM

## 2014-11-06 DIAGNOSIS — I495 Sick sinus syndrome: Secondary | ICD-10-CM

## 2014-11-06 DIAGNOSIS — I119 Hypertensive heart disease without heart failure: Secondary | ICD-10-CM | POA: Diagnosis not present

## 2014-11-06 DIAGNOSIS — F0280 Dementia in other diseases classified elsewhere without behavioral disturbance: Secondary | ICD-10-CM

## 2014-11-06 NOTE — Progress Notes (Signed)
Patient ID: Julie Serrano, female   DOB: 02/02/1925, 80 y.o.   MRN: [id number]               PROGRESS NOTE  DATE:  11/06/2014                     FACILITY: [facility]     LEVEL OF CARE:   SNF   Routine Visit   CHIEF COMPLAINT:  Review of medical issues/[facility] visit.    HISTORY OF PRESENT ILLNESS:    She was admitted to this facility in April 2015.  At that point, she had had a pacemaker insertion for complete heart block.  She is on chronic Coumadin.    She has not had any major issues in the facility.  She did have what sounds like a viral gastroenteritis this month.  PAST MEDICAL HISTORY/PROBLEM LIST:                   Depression with anxiety.    Mild to moderate dementia.    Complete heart block.  Status post pacemaker in 2015.  Follows with Dr. Ladona Ridgel of Cardiology.    Hypertension.    Hyperlipidemia.    Diverticulosis/diverticulitis.    History of an esophageal stricture.    PAST SURGICAL HISTORY:            Appendectomy.    Cholecystectomy.          Abdominal hysterectomy.     Pacemaker insertion.    CURRENT MEDICATIONS:   Medication list is reviewed.     Amiodarone 200 mg daily.    Lexapro 10 q.d.      Protonix 40 b.i.d., for severe GERD.    Colace 100 b.i.d.     Calcium carbonate 600 q.h.s.      Zetia 10 mg at bedtime.    Melatonin 3 mg at bedtime.    Remeron 7.5 q.d.      Xalatan ophthalmic for glaucoma.    Vitamin B12, 1000 mcg every month.     Vitamin D3, 50,000 U monthly.    Claritin 10 mg a day.    Coumadin 2 mg daily.     SOCIAL HISTORY:   ADVANCED DIRECTIVES:  The patient is listed as a Full Code.    REVIEW OF SYSTEMS:     CHEST/RESPIRATORY:  No shortness of breath.  CARDIAC:   No chest pain.    GI:  No abdominal pain.  No rectal bleeding.   GU:  No dysuria.             PHYSICAL EXAMINATION:   VITAL SIGNS:   BLOOD PRESSURE:  118/72.   PULSE:  64.   RESPIRATIONS:  18.   GENERAL APPEARANCE:  The patient is not in  any distress.    CHEST/RESPIRATORY:  Exam is clear.    CARDIOVASCULAR:  CARDIAC:   Heart sounds are normal.  Pacemaker in place.   GASTROINTESTINAL:  ABDOMEN:   Soft.   LIVER/SPLEEN/KIDNEYS:  No liver, no spleen.    ASSESSMENT/PLAN:                      Sick sinus syndrome.  Status post pacemaker with severe LVH.  It would appear that her blood pressure is under good control.  She is on chronic Coumadin, although there have been recent issues with getting that into the therapeutic range.    Mild to moderate dementia.  She might benefit from Aricept  or a similar medication. Her Mini-Mental status score is 20    History of esophageal stricture.  Is on high-dose PPIs.  Does not seem to be problematic.    Hypertensive heart disease with severe LVH.     CPT CODE: 1610999309

## 2014-11-29 ENCOUNTER — Non-Acute Institutional Stay (SKILLED_NURSING_FACILITY): Payer: Medicare Other | Admitting: Internal Medicine

## 2014-11-29 DIAGNOSIS — F0280 Dementia in other diseases classified elsewhere without behavioral disturbance: Secondary | ICD-10-CM

## 2014-11-29 DIAGNOSIS — J189 Pneumonia, unspecified organism: Secondary | ICD-10-CM

## 2014-11-29 DIAGNOSIS — G309 Alzheimer's disease, unspecified: Secondary | ICD-10-CM | POA: Diagnosis not present

## 2014-11-29 NOTE — Progress Notes (Signed)
Patient ID: Julie Serrano, female   DOB: June 27, 1925, 81 y.o.   MRN: [id number]                PROGRESS NOTE  DATE:  11/29/2014                     FACILITY: [facility]     LEVEL OF CARE:   SNF   Routine Visit   CHIEF COMPLAINT:  Review of medical issues/[facility] visit.    HISTORY OF PRESENT ILLNESS:    She was admitted to this facility in April 2015.  At that point, she had had a pacemaker insertion for complete heart block.  She is on chronic Coumadin.    She has not had any major issues in the facility. He was placed on levaquin for 7 days for a CxR that showed a RLL density c/s ATx or penumonia  PAST MEDICAL HISTORY/PROBLEM LIST:                   Depression with anxiety.    Mild to moderate dementia.    Complete heart block.  Status post pacemaker in 2015.  Follows with Dr. Ladona Ridgelaylor of Cardiology.    Hypertension.    Hyperlipidemia.    Diverticulosis/diverticulitis.    History of an esophageal stricture.    PAST SURGICAL HISTORY:            Appendectomy.    Cholecystectomy.          Abdominal hysterectomy.     Pacemaker insertion.    CURRENT MEDICATIONS:   Medication list is reviewed.     Amiodarone 200 mg daily.    Lexapro 10 q.d.      Protonix 40 b.i.d., for severe GERD.    Colace 100 b.i.d.     Calcium carbonate 600 q.h.s.      Zetia 10 mg at bedtime.    Melatonin 3 mg at bedtime.    Remeron 7.5 q.d.      Xalatan ophthalmic for glaucoma.    Vitamin B12, 1000 mcg every month.     Vitamin D3, 50,000 U monthly.    Claritin 10 mg a day.    Coumadin 1.5 mg daily.     SOCIAL HISTORY:   ADVANCED DIRECTIVES:  The patient is listed as a Full Code.    REVIEW OF SYSTEMS:     CHEST/RESPIRATORY:  No shortness of breath.  CARDIAC:   No chest pain.    GI:  No abdominal pain.  No rectal bleeding.   GU:  No dysuria.             PHYSICAL EXAMINATION:   VITAL SIGNS:   O2csat 95% on room air.  BLOOD PRESSURE:     PULSE:  72   RESPIRATIONS:   18.   GENERAL APPEARANCE:  The patient is not in any distress.    CHEST/RESPIRATORY:  Exam is clear.    CARDIOVASCULAR:  CARDIAC:   Heart sounds are normal.  Pacemaker in place.   GASTROINTESTINAL:  ABDOMEN:   Soft.   LIVER/SPLEEN/KIDNEYS:  No liver, no spleen.    ASSESSMENT/PLAN:                      Sick sinus syndrome.  Status post pacemaker with severe LVH.  It would appear that her blood pressure is under good control.  She is on chronic Coumadin, although there have been recent issues with getting that into the  therapeutic range.    Mild to moderate dementia.  She might benefit from Aricept or a similar medication. Her Mini-Mental status score is 20    History of esophageal stricture.  Is on high-dose PPIs.  Does not seem to be problematic.    Hypertensive heart disease with severe LVH.    Treated for Pneumonia this month. She appear to be stable. Exam is stable. O2 sat 95% on room air  CPT CODE: 16109

## 2015-01-03 ENCOUNTER — Non-Acute Institutional Stay (SKILLED_NURSING_FACILITY): Payer: Medicare Other | Admitting: Internal Medicine

## 2015-01-03 DIAGNOSIS — Z950 Presence of cardiac pacemaker: Secondary | ICD-10-CM | POA: Diagnosis not present

## 2015-01-03 DIAGNOSIS — I119 Hypertensive heart disease without heart failure: Secondary | ICD-10-CM

## 2015-01-03 DIAGNOSIS — I495 Sick sinus syndrome: Secondary | ICD-10-CM

## 2015-01-06 NOTE — Progress Notes (Addendum)
Patient ID: Julie Serrano, female   DOB: Sep 06, 1925, 81 y.o.   MRN: [id number]                PROGRESS NOTE  DATE:  01/03/2015         FACILITY: [facility]                           LEVEL OF CARE:   SNF   Routine Visit                     CHIEF COMPLAINT:  Review of medical issues/[facility] visit.      HISTORY OF PRESENT ILLNESS:  This is a patient who was admitted to the facility a year ago.  At that point, she had a pacemaker insertion for complete heart block.    She has mild to moderate dementia.    She did not leave the facility and became an ongoing resident.  I am not completely sure of the reasons for this.    I assumed her care in December 2015.  She is also followed by [facility].     Her major issue seems to be pneumonia recently in March.  Otherwise, she has done well.    PAST MEDICAL HISTORY/PROBLEM LIST:                      Depression with anxiety.    Mild to moderate dementia.    Complete heart block.  Status post pacemaker in 2015.  Follows with Dr. Ladona Ridgelaylor of Cardiology.    Hypertension.    Hyperlipidemia.    Diverticulosis/diverticulitis.    History of esophageal stricture.     PAST SURGICAL HISTORY:           Appendectomy.    Cholecystectomy.     Abdominal hysterectomy.     Pacemaker insertion.      CURRENT MEDICATIONS:  Medication list is reviewed.           Amiodarone 200 q.d.       Lexapro 10 q.d.          Flonase 50 mcg daily to each nostril for allergies.    Claritin 10 q.d.       Protonix 40 q.d.       Colace 100 b.i.d.       Tylenol Extra-Strength 1000 every 8 hours.      Saline nasal spray four times a day.    Calcium carbonate 600 at bedtime.     Zetia 10 q.d.       Remeron 7.5 q.h.s.      Xalatan ophthalmic.    Vitamin B12, 1000 mcg monthly.    Vitamin D3, 50,000 U monthly.    Coumadin 1.5 every day.    REVIEW OF SYSTEMS:    CHEST/RESPIRATORY:  The patient is not complaining of shortness of breath.      CARDIAC:  No complaints of chest pain or palpitations.   GI:  No abdominal pain.  No GI bleeding.      PHYSICAL EXAMINATION:   VITAL SIGNS:   O2 SATURATIONS:  95%.     RESPIRATIONS:    18.     PULSE:     64.    GENERAL APPEARANCE:  The patient is awake, alert, conversational.   CHEST/RESPIRATORY:  Clear air entry bilaterally.    CARDIOVASCULAR:   CARDIAC:  Harsh 3/6 non-radiating murmur heard maximal over the  left sternal border.  Echocardiogram from 2015 showed a normal ejection fraction, turbulence in the left ventricular outflow tract, and severe LVH.  I did not see evidence of aortic stenosis.   GASTROINTESTINAL:   ABDOMEN:  Soft.     LIVER/SPLEEN/KIDNEYS:  No liver, no spleen.    ASSESSMENT/PLAN:                 Sick sinus syndrome.  Status post pacemaker.    Hypertension with severe LVH.  Review of her record shows that her blood pressure seems to be under good control, mostly in the 120s/70s.    Mild to moderate dementia.  Mini-mental status score is 20.    History of a distal esophageal stricture.  She is on PPIs.    No complaints here.

## 2015-01-11 ENCOUNTER — Ambulatory Visit (INDEPENDENT_AMBULATORY_CARE_PROVIDER_SITE_OTHER): Payer: Medicare Other | Admitting: Internal Medicine

## 2015-01-11 ENCOUNTER — Encounter: Payer: Self-pay | Admitting: Internal Medicine

## 2015-01-11 VITALS — BP 118/62 | HR 68 | Ht 66.0 in | Wt 121.6 lb

## 2015-01-11 DIAGNOSIS — I482 Chronic atrial fibrillation, unspecified: Secondary | ICD-10-CM

## 2015-01-11 DIAGNOSIS — I442 Atrioventricular block, complete: Secondary | ICD-10-CM

## 2015-01-11 DIAGNOSIS — I459 Conduction disorder, unspecified: Secondary | ICD-10-CM | POA: Diagnosis not present

## 2015-01-11 DIAGNOSIS — I10 Essential (primary) hypertension: Secondary | ICD-10-CM

## 2015-01-11 DIAGNOSIS — Z950 Presence of cardiac pacemaker: Secondary | ICD-10-CM

## 2015-01-11 LAB — CUP PACEART INCLINIC DEVICE CHECK
Battery Impedance: 111 Ohm
Brady Statistic AP VS Percent: 0 %
Brady Statistic AS VP Percent: 73 %
Date Time Interrogation Session: 20160503120954
Lead Channel Impedance Value: 459 Ohm
Lead Channel Impedance Value: 560 Ohm
Lead Channel Pacing Threshold Amplitude: 0.5 V
Lead Channel Pacing Threshold Pulse Width: 0.4 ms
Lead Channel Sensing Intrinsic Amplitude: 4 mV
Lead Channel Sensing Intrinsic Amplitude: 5.6 mV
Lead Channel Setting Pacing Amplitude: 2 V
Lead Channel Setting Pacing Pulse Width: 0.4 ms
MDC IDC MSMT BATTERY REMAINING LONGEVITY: 133 mo
MDC IDC MSMT BATTERY VOLTAGE: 2.78 V
MDC IDC MSMT LEADCHNL RV PACING THRESHOLD AMPLITUDE: 0.75 V
MDC IDC MSMT LEADCHNL RV PACING THRESHOLD PULSEWIDTH: 0.4 ms
MDC IDC SET LEADCHNL RV PACING AMPLITUDE: 2.5 V
MDC IDC SET LEADCHNL RV SENSING SENSITIVITY: 4 mV
MDC IDC STAT BRADY AP VP PERCENT: 27 %
MDC IDC STAT BRADY AS VS PERCENT: 0 %

## 2015-01-11 NOTE — Assessment & Plan Note (Signed)
Her blood pressure is well controlled. She will maintain a low-sodium diet and continue her current medications.

## 2015-01-11 NOTE — Patient Instructions (Signed)
Medication Instructions:  Your physician recommends that you continue on your current medications as directed. Please refer to the Current Medication list given to you today.   Labwork: None ordered  Testing/Procedures: None ordered  Follow-Up: Your physician wants you to follow-up in: 12 months with Dr Taylor You will receive a reminder letter in the mail two months in advance. If you don't receive a letter, please call our office to schedule the follow-up appointment.  Remote monitoring is used to monitor your Pacemaker of ICD from home. This monitoring reduces the number of office visits required to check your device to one time per year. It allows us to keep an eye on the functioning of your device to ensure it is working properly. You are scheduled for a device check from home on 04/12/15. You may send your transmission at any time that day. If you have a wireless device, the transmission will be sent automatically. After your physician reviews your transmission, you will receive a postcard with your next transmission date.    Any Other Special Instructions Will Be Listed Below (If Applicable).   

## 2015-01-11 NOTE — Progress Notes (Signed)
HPI Julie Serrano returns today for ongoing evaluation of her PPM and complete heart block. She is a pleasant elderly woman with PAF on amiodarone, chronic diastolic heart failure, and dyslipidemia. She lives in assisted living facility. She denies chest pain or sob. No syncope. No falls. She is fairly sedentary. Allergies  Allergen Reactions  . Ativan [Lorazepam] Other (See Comments)    Extreme delirium  . Sulfa Antibiotics     unknown     Current Outpatient Prescriptions  Medication Sig Dispense Refill  . acetaminophen (TYLENOL) 650 MG CR tablet Take 650 mg by mouth once as needed for pain.    Marland Kitchen amiodarone (PACERONE) 200 MG tablet Take 1 tablet (200 mg total) by mouth daily.    . calcium carbonate (OS-CAL) 600 MG TABS tablet Take 600 mg by mouth at bedtime.     . docusate sodium (COLACE) 100 MG capsule Take 100 mg by mouth at bedtime.    Marland Kitchen escitalopram (LEXAPRO) 10 MG tablet Take 10 mg by mouth daily.    Marland Kitchen ezetimibe (ZETIA) 10 MG tablet Take 10 mg by mouth at bedtime.     . fluticasone (FLONASE) 50 MCG/ACT nasal spray Place 1 spray into both nostrils daily.    Marland Kitchen latanoprost (XALATAN) 0.005 % ophthalmic solution Place 1 drop into both eyes at bedtime.    Marland Kitchen loratadine (CLARITIN) 10 MG tablet Take 10 mg by mouth daily.    . mirtazapine (REMERON) 15 MG tablet Take 7.5 mg by mouth at bedtime.     . pantoprazole (PROTONIX) 40 MG tablet Take 40 mg by mouth 2 (two) times daily.    . sodium chloride (OCEAN) 0.65 % SOLN nasal spray Place 1 spray into both nostrils as needed for congestion.    Marland Kitchen warfarin (COUMADIN) 2 MG tablet Take 2.5 mg by mouth daily.     No current facility-administered medications for this visit.     Past Medical History  Diagnosis Date  . Anxiety and depression   . Arthritis   . Atrial fibrillation     a. amiodarone  . Hypertension   . Hyperlipemia   . Diverticulitis   . Esophageal stricture   . Complete heart block     s/p dual chamber PPM implantation  April 2015    ROS:   All systems reviewed and negative except as noted in the HPI.   Past Surgical History  Procedure Laterality Date  . Appendectomy    . Cholecystectomy    . Abdominal hysterectomy    . Pacemaker insertion  12-28-2013    MDT dual chamber pacemaker implanted by Dr Ladona Ridgel for CHB  . Left heart catheterization with coronary angiogram N/A 12/27/2013    Procedure: LEFT HEART CATHETERIZATION WITH CORONARY ANGIOGRAM;  Surgeon: Corky Crafts, MD;  Location: [facility] CATH LAB;  Service: Cardiovascular;  Laterality: N/A;  . Permanent pacemaker insertion N/A 12/28/2013    Procedure: PERMANENT PACEMAKER INSERTION;  Surgeon: Marinus Maw, MD;  Location: [facility] CATH LAB;  Service: Cardiovascular;  Laterality: N/A;     No family history on file.   History   Social History  . Marital Status: Unknown    Spouse Name: N/A  . Number of Children: 4  . Years of Education: N/A   Occupational History  . Retired    Social History Main Topics  . Smoking status: Never Smoker   . Smokeless tobacco: Never Used  . Alcohol Use: Yes  . Drug Use: No  .  Sexual Activity: Not Currently   Other Topics Concern  . Not on file   Social History Narrative     BP 118/62 mmHg  Pulse 68  Ht 5\' 6"  (1.676 m)  Wt 121 lb 9.6 oz (55.157 kg)  BMI 19.64 kg/m2  Physical Exam:  Well appearing 81 yo woman, NAD HEENT: Unremarkable Neck:  No JVD, no thyromegally Back:  No CVA tenderness Lungs:  Clear with no wheezes, rales, or rhonchi HEART:  Regular rate rhythm, no murmurs, no rubs, no clicks Abd:  soft, positive bowel sounds, no organomegally, no rebound, no guarding Ext:  2 plus pulses, no edema, no cyanosis, no clubbing Skin:  No rashes no nodules Neuro:  CN II through XII intact, motor grossly intact  EKG - P synchronous ventricular pacing  DEVICE  Normal device function.  See PaceArt for details.   Assess/Plan:

## 2015-01-11 NOTE — Assessment & Plan Note (Signed)
Her Medtronic dual-chamber pacemaker is working normally. We'll plan to recheck in several months. 

## 2015-01-11 NOTE — Assessment & Plan Note (Signed)
She is maintaining NSR 99.9% of the time. She will continue her current meds. 

## 2015-01-11 NOTE — Assessment & Plan Note (Signed)
She has had no recurrent syncopal episodes. No change in medical therapy today.

## 2015-01-14 ENCOUNTER — Encounter: Payer: Self-pay | Admitting: Internal Medicine

## 2015-02-07 ENCOUNTER — Non-Acute Institutional Stay (SKILLED_NURSING_FACILITY): Payer: Medicare Other | Admitting: Internal Medicine

## 2015-02-07 DIAGNOSIS — F0280 Dementia in other diseases classified elsewhere without behavioral disturbance: Secondary | ICD-10-CM

## 2015-02-07 DIAGNOSIS — Z950 Presence of cardiac pacemaker: Secondary | ICD-10-CM

## 2015-02-07 DIAGNOSIS — I495 Sick sinus syndrome: Secondary | ICD-10-CM

## 2015-02-07 DIAGNOSIS — I119 Hypertensive heart disease without heart failure: Secondary | ICD-10-CM

## 2015-02-07 DIAGNOSIS — G309 Alzheimer's disease, unspecified: Secondary | ICD-10-CM | POA: Diagnosis not present

## 2015-02-07 NOTE — Progress Notes (Signed)
Patient ID: Julie Serrano, female   DOB: Jan 23, 1925, 81 y.o.   MRN: [id number]                PROGRESS NOTE  DATE:  01/31/2015         FACILITY: [facility]                           LEVEL OF CARE:   SNF   Routine Visit                     CHIEF COMPLAINT:  Review of medical issues/[facility] visit.      HISTORY OF PRESENT ILLNESS:  This is a patient who was admitted to the facility a year ago.  At that point, she had a pacemaker insertion for complete heart block.  She follows with Dr. Ladona Ridgelaylor of cardiology and she was seen earlier this month. He states she maintains normal sinus rhythm most of the time. [99.9%]. There does not appear to be any major issues. She did complain of dysuria earlier this month however her urinalysis was clear. Her INR was slightly elevated earlier this month at 3.3 her Coumadin at 2 mg was reduced to 1.5  She has mild to moderate dementia.    She did not leave the facility and became an ongoing resident.  I am not completely sure of the reasons for this.    I assumed her care in December 2015.  She is also followed by [facility].     Her major issue seems to be pneumonia recently in March.  Otherwise, she has done well.    PAST MEDICAL HISTORY/PROBLEM LIST:                      Depression with anxiety.    Mild to moderate dementia.    Complete heart block.  Status post pacemaker in 2015.  Follows with Dr. Ladona Ridgelaylor of Cardiology.    Hypertension.    Hyperlipidemia.    Diverticulosis/diverticulitis.    History of esophageal stricture.     PAST SURGICAL HISTORY:           Appendectomy.    Cholecystectomy.     Abdominal hysterectomy.     Pacemaker insertion.      Labs 5/18 liver function tests normal 5/18 TSH 1.04  CURRENT MEDICATIONS:  Medication list is reviewed.           Amiodarone 200 q.d.       Lexapro 10 q.d.          Flonase 50 mcg daily to each nostril for allergies.    Claritin 10 q.d.       Protonix 40 q.d.       Colace 100  b.i.d.       Tylenol Extra-Strength 1000 every 8 hours.      Saline nasal spray four times a day.    Calcium carbonate 600 at bedtime.     Zetia 10 q.d.       Remeron 7.5 q.h.s.      Xalatan ophthalmic.    Vitamin B12, 1000 mcg monthly.    Vitamin D3, 50,000 U monthly.    Coumadin 1.5 every day.    REVIEW OF SYSTEMS:    CHEST/RESPIRATORY:  The patient is not complaining of shortness of breath.    CARDIAC:  No complaints of chest pain or palpitations.   GI:  No abdominal pain.  No GI bleeding.      PHYSICAL EXAMINATION:   VITAL SIGNS:   O2 SATURATIONS:  95%.     RESPIRATIONS:    18.  BP; 112/72    PULSE:     78   GENERAL APPEARANCE:  The patient is awake, alert, conversational.   CHEST/RESPIRATORY:  Clear air entry bilaterally.    CARDIOVASCULAR:   CARDIAC:  Harsh 3/6 non-radiating murmur heard maximal over the left sternal border.  Echocardiogram from 2015 showed a normal ejection fraction, turbulence in the left ventricular outflow tract, and severe LVH.  I did not see evidence of aortic stenosis.   GASTROINTESTINAL:   ABDOMEN:  Soft.     LIVER/SPLEEN/KIDNEYS:  No liver, no spleen.    ASSESSMENT/PLAN:                 Sick sinus syndrome.  Status post pacemaker.  She is followed with Dr. Ladona Ridgel.  On Coumadin, dose adjusted  Hypertension with severe LVH.  Review of her record shows that her blood pressure seems to be under good control, mostly in the 120s/70s.    Mild to moderate dementia.  Mini-mental status score is 20.    History of a distal esophageal stricture.  She is on PPIs.    No complaints here.

## 2015-03-09 ENCOUNTER — Non-Acute Institutional Stay (SKILLED_NURSING_FACILITY): Payer: Medicare Other | Admitting: Internal Medicine

## 2015-03-09 DIAGNOSIS — K8010 Calculus of gallbladder with chronic cholecystitis without obstruction: Secondary | ICD-10-CM

## 2015-03-15 NOTE — Progress Notes (Addendum)
Patient ID: Julie Serrano, female   DOB: 01/23/1925, 81 y.o.   MRN: [id number]                PROGRESS NOTE  DATE:  03/09/2015            FACILITY[facility]                   LEVEL OF CARE:   SNF   Acute Visit                       CHIEF COMPLAINT:  Follow up complaints of right upper quadrant pain.      HISTORY OF PRESENT ILLNESS:  This is a patient who has mild to moderate dementia.    Earlier this month, she complained of right-sided pain in her upper abdomen.  There was some question of whether she had a mass.  She was seen and had a series of tests and evaluations.  I think at first it was not sure that this was abdominal.  She had a chest x-ray that was negative.  A KUB was apparently negative.  An ultrasound was done in the facility showing cholelithiasis.  Her common bile duct was not dilated.  She was sent to see General Surgery.  Given her other comorbidities, they did not feel that surgery was indicated.  However, she developed an acute attack that was unrelenting and was sent to the emergency room.    PHYSICAL EXAMINATION:   CHEST/RESPIRATORY:  Exam is clear.      CARDIOVASCULAR:   CARDIAC:  Heart sounds are normal.  There are no murmurs.    GASTROINTESTINAL:   ABDOMEN:  No masses.  No tenderness.  Previously of note, she had tenderness when examined by the [facility] nurse practitioner earlier this month.   LIVER/SPLEEN/KIDNEYS:  No liver, no spleen.    ASSESSMENT/PLAN:                            Cholelithiasis.  Given the tone of the notes, she has probably been symptomatic.  Nevertheless, it was not felt, given her other comorbidities, that surgery was currently indicated.  We will continue to keep an eye on this in the facility.

## 2015-04-03 ENCOUNTER — Non-Acute Institutional Stay (SKILLED_NURSING_FACILITY): Payer: Medicare Other | Admitting: Internal Medicine

## 2015-04-03 DIAGNOSIS — I495 Sick sinus syndrome: Secondary | ICD-10-CM | POA: Diagnosis not present

## 2015-04-03 DIAGNOSIS — K8010 Calculus of gallbladder with chronic cholecystitis without obstruction: Secondary | ICD-10-CM

## 2015-04-03 DIAGNOSIS — G309 Alzheimer's disease, unspecified: Secondary | ICD-10-CM

## 2015-04-03 DIAGNOSIS — Z950 Presence of cardiac pacemaker: Secondary | ICD-10-CM

## 2015-04-03 DIAGNOSIS — R1314 Dysphagia, pharyngoesophageal phase: Secondary | ICD-10-CM | POA: Diagnosis not present

## 2015-04-03 DIAGNOSIS — F0280 Dementia in other diseases classified elsewhere without behavioral disturbance: Secondary | ICD-10-CM

## 2015-04-07 ENCOUNTER — Ambulatory Visit (HOSPITAL_COMMUNITY)
Admission: RE | Admit: 2015-04-07 | Discharge: 2015-04-07 | Disposition: A | Discharge status: Home / Self Care | Payer: Medicare Other | Source: Ambulatory Visit | Attending: Internal Medicine | Admitting: Internal Medicine

## 2015-04-07 ENCOUNTER — Other Ambulatory Visit: Payer: Self-pay | Admitting: Internal Medicine

## 2015-04-07 DIAGNOSIS — K224 Dyskinesia of esophagus: Secondary | ICD-10-CM | POA: Diagnosis not present

## 2015-04-07 DIAGNOSIS — K222 Esophageal obstruction: Secondary | ICD-10-CM

## 2015-04-07 DIAGNOSIS — R1310 Dysphagia, unspecified: Secondary | ICD-10-CM | POA: Diagnosis present

## 2015-04-07 NOTE — Progress Notes (Addendum)
Patient ID: Julie Serrano, female   DOB: [birth year], 81 y.o.   MRN: [id number]                PROGRESS NOTE  DATE:  04/03/2015           [facility]ACILITY[facility]                      LEVEL OF CARE:   SNF   Routine Visit                CHIEF COMPLAINT:  Review of medical issues/[facility] visit.      HISTORY OF PRESENT ILLNESS:  This is a patient who was admitted to the facility a year ago.  At that point, she had had a pacemaker insertion for complete heart block, followed by Dr. Ladona Ridgel of Cardiology.    She has atrial fibrillation, but remains in sinus rhythm most of the time.  She is on Coumadin.    She was discovered having abdominal pain with a facility ultrasound showing gallstones.  She saw General Surgery.  However, the feeling was that unless she became symptomatic or sick, they would not pursue a surgical option.    PAST MEDICAL HISTORY/PROBLEM LIST:            Depression with anxiety.     Mild to moderate dementia.     Complete heart block.  Status post pacemaker.    Paroxymal atrial fibrillation.    Hypertension.    Hyperlipidemia.    Diverticulitis/diverticulosis.    Esophageal stricture.  Schatzki's ring and a 4cm HH at [facility] in March 2015  Ultrasound of the abdomen in the facility on 02/11/2015 showing cholelithiasis, bile duct at 0.6 sq cm, within the normal limits.  Her pancreas was normal.    PAST SURGICAL HISTORY:    Appendectomy.    Abdominal hysterectomy.    Pacemaker insertion.    CURRENT MEDICATIONS:  Medication list is reviewed.            Amiodarone 1000 q.d.     Lexapro 10 q.d.      Flonase 50 daily.    Claritin 10 q.d.      Protonix 40 q.d.      Tylenol Extra-Strength 1 g b.i.d.     Senna Plus 8.6 b.i.d.      Saline nasal spray four times daily.    Calcium carbonate 600 daily.    Zetia 10 q.d.      Remeron 7.5 q.d.      Xalatan ophthalmic at night, OU.       Vitamin B12, 1000 monthly.    Vitamin D3, 50,000 U every  month.      Coumadin 1.5 mg a day.      REVIEW OF SYSTEMS:    CHEST/RESPIRATORY:  The patient is not complaining of shortness of breath.   CARDIAC:  No clear chest pain.   GI:  No recent abdominal pain.   GU:  No dysuria.      PHYSICAL EXAMINATION:   VITAL SIGNS:     TEMPERATURE:  97.1.      PULSE:  70.      RESPIRATIONS:  20.     BLOOD PRESSURE:  110/84.     WEIGHT:  121 pounds.      CHEST/RESPIRATORY:  Clear air entry bilaterally.    CARDIOVASCULAR:   CARDIAC:  Heart sounds are normal and regular.  Pacemaker in the left chest.   GASTROINTESTINAL:  ABDOMEN:  No masses.     LIVER/SPLEEN/KIDNEYS:  No liver, no spleen.  No tenderness.     PSYCHIATRIC:   MENTAL STATUS:  No evidence of depression.    ASSESSMENT/PLAN:            Sick sinus syndrome.  Status post pacemaker.   She is on Coumadin.  This is stable.    Hypertension with severe LVH.  Her blood pressure appears to be under good control.  I have reviewed the facility notes, most recently at 110 systolic.    Mild to moderate dementia.  Mini-mental status score of 20.    History of a distal esophageal stricture.  She is on PPIs and has no complaints to me but apparently to other staff members. Will to a Barium swallow to exclude a major peptic stricture. Discussed with the [facility] staff  ?Symptomatic cholelithiasis.  We are simply monitoring this.  She does not have any complaints.

## 2015-04-12 ENCOUNTER — Telehealth: Payer: Self-pay | Admitting: Cardiology

## 2015-04-12 ENCOUNTER — Encounter: Payer: Medicare Other | Admitting: *Deleted

## 2015-04-12 NOTE — Telephone Encounter (Signed)
LMOVM reminding pt to send remote transmission.   

## 2015-04-13 ENCOUNTER — Encounter: Payer: Self-pay | Admitting: Cardiology

## 2015-06-22 ENCOUNTER — Non-Acute Institutional Stay (SKILLED_NURSING_FACILITY): Payer: Medicare Other | Admitting: Internal Medicine

## 2015-06-22 DIAGNOSIS — R1314 Dysphagia, pharyngoesophageal phase: Secondary | ICD-10-CM | POA: Diagnosis not present

## 2015-06-22 DIAGNOSIS — K8010 Calculus of gallbladder with chronic cholecystitis without obstruction: Secondary | ICD-10-CM

## 2015-06-22 DIAGNOSIS — I480 Paroxysmal atrial fibrillation: Secondary | ICD-10-CM

## 2015-06-22 DIAGNOSIS — Z950 Presence of cardiac pacemaker: Secondary | ICD-10-CM | POA: Diagnosis not present

## 2015-06-29 NOTE — Progress Notes (Addendum)
Patient ID: Julie Serrano, female   DOB: Nov 16, 1924, 81 y.o.   MRN: [id number]                PROGRESS NOTE  DATE:  06/22/2015          [facility]                     LEVEL OF CARE:   SNF   Routine Visit               CHIEF COMPLAINT:  Review of medical issues/[facility] visit.       HISTORY OF PRESENT ILLNESS:  This is a patient who was admitted to the facility in 2015.  At that point, she had had a pacemaker insertion for complete heart block.    She has atrial fibrillation and remains on Coumadin.    She was discovered to have gallstones in the facility, presenting with abdominal pain.  She saw General Surgery.   However, the feeling was that unless she became symptomatically ill, they would not consider her a surgical option.  Fortunately, this does not seem to have been a problem.    The patient has a history of depression with anxiety, mild to moderate dementia.  She follows with Psychiatry in the facility.  She is generally felt to be fairly stable.    PAST MEDICAL HISTORY/PROBLEM LIST:          Depression with anxiety.     Mild to moderate dementia.     Complete heart block.  Status post pacemaker.    Paroxysmal atrial fibrillation.    Hypertension.    Hyperlipidemia.     Diverticulosis/diverticulitis.     Esophageal stricture with a Schatzki's ring and a 4 cm hiatal hernia on March 2015 endoscopy.    Ultrasound in the facility on 02/11/2015 showed cholelithiasis, normal bile duct.  Her pancreas was normal.    Chronic anticoagulation.     Stage III chronic renal failure related to her hypertension.    PAST SURGICAL HISTORY:             Appendectomy.    Abdominal hysterectomy.    Pacemaker insertion.      CURRENT MEDICATIONS:  Medication list is reviewed.         Amiodarone 200 daily.     Lexapro 10 q.d.      Flonase 50 mcg daily.     Claritin 10 q.d.      Senna 8.6/50, 1 tablet twice a day.    Protonix 40 b.i.d.       Tylenol 1 g  every 8 hours.    Calcium 600 daily.     Zetia 10 daily.     Remeron 7.5 q.d.     Xalatan ophthalmic.    Vitamin B12, 1000 monthly.     Vitamin D3, 50,000 U monthly.    Coumadin 2 mg daily, with recent INR within the normal range.      REVIEW OF SYSTEMS:    GENERAL:  The patient states she feels well. No recent weight loss  CHEST/RESPIRATORY:  No shortness of breath.   CARDIAC:  No chest pain.   GI:  No recent abdominal pain.  No nausea.   GU:  No dysuria.    MUSCULOSKELETAL:  Complains of lower extremity pain, probably mostly osteoarthritic in knees and hips.     CNS: no complaints of new weakness  Mental Status: Nurses report she is at baseline  PHYSICAL EXAMINATION:   VITAL SIGNS:     TEMPERATURE:  97.4.    PULSE:  66.     RESPIRATIONS:  15.    BLOOD PRESSURE:  140/80.    WEIGHT:  117 pounds.  This has generally been stable for the last 4-5 months.   CHEST/RESPIRATORY:  Clear air entry bilaterally.    CARDIOVASCULAR:   CARDIAC:  Heart sounds are normal.  Pacemaker in the left chest.   GASTROINTESTINAL:   ABDOMEN:  Normal.  No masses.   No tenderness.    PSYCHIATRIC:   MENTAL STATUS:  She appears bright and cheerful.  No evidence of depression.    ASSESSMENT/PLAN:                  Atrial Fib with a hsitory of CHB  Status post pacemaker.  She is stable on Coumadin.  She is stable on amiodarone. There were some concerns raised in 2015 wrt risks of falls on coumadin. This is not an issue that i am aware of.   Hypertension with severe LVH and a history of stage III chronic renal failure.   Blood pressure is under good control.    Mild to moderate dementia.  Mini-mental status score of 20.    History of a distal esophageal stricture, without particular complaints currently.  In reviewing records this was a schatski's ring above a Hiatal hernia. Last endo in 2015.continue PPI bid.  History of cholelithiasis.  Currently, no issues.  She is being managed expectantly.  Not  felt to be an elective surgical candidate. Currently not experiencing symptons

## 2015-08-03 ENCOUNTER — Inpatient Hospital Stay (HOSPITAL_COMMUNITY)
Admission: EM | Admit: 2015-08-03 | Discharge: 2015-08-11 | DRG: 314 | Disposition: E | Payer: Medicare Other | Attending: Internal Medicine | Admitting: Internal Medicine

## 2015-08-03 ENCOUNTER — Encounter (HOSPITAL_COMMUNITY): Payer: Self-pay | Admitting: Emergency Medicine

## 2015-08-03 ENCOUNTER — Emergency Department (HOSPITAL_COMMUNITY): Payer: Medicare Other

## 2015-08-03 DIAGNOSIS — F419 Anxiety disorder, unspecified: Secondary | ICD-10-CM | POA: Diagnosis present

## 2015-08-03 DIAGNOSIS — E785 Hyperlipidemia, unspecified: Secondary | ICD-10-CM | POA: Diagnosis present

## 2015-08-03 DIAGNOSIS — N390 Urinary tract infection, site not specified: Secondary | ICD-10-CM | POA: Diagnosis present

## 2015-08-03 DIAGNOSIS — N179 Acute kidney failure, unspecified: Secondary | ICD-10-CM | POA: Diagnosis present

## 2015-08-03 DIAGNOSIS — Y838 Other surgical procedures as the cause of abnormal reaction of the patient, or of later complication, without mention of misadventure at the time of the procedure: Secondary | ICD-10-CM | POA: Diagnosis present

## 2015-08-03 DIAGNOSIS — Y95 Nosocomial condition: Secondary | ICD-10-CM | POA: Diagnosis present

## 2015-08-03 DIAGNOSIS — Z79899 Other long term (current) drug therapy: Secondary | ICD-10-CM | POA: Diagnosis not present

## 2015-08-03 DIAGNOSIS — E860 Dehydration: Secondary | ICD-10-CM | POA: Diagnosis present

## 2015-08-03 DIAGNOSIS — Z515 Encounter for palliative care: Secondary | ICD-10-CM | POA: Diagnosis present

## 2015-08-03 DIAGNOSIS — J189 Pneumonia, unspecified organism: Secondary | ICD-10-CM | POA: Diagnosis present

## 2015-08-03 DIAGNOSIS — R6520 Severe sepsis without septic shock: Secondary | ICD-10-CM | POA: Diagnosis present

## 2015-08-03 DIAGNOSIS — W19XXXA Unspecified fall, initial encounter: Secondary | ICD-10-CM | POA: Diagnosis present

## 2015-08-03 DIAGNOSIS — G9340 Encephalopathy, unspecified: Secondary | ICD-10-CM | POA: Diagnosis present

## 2015-08-03 DIAGNOSIS — I421 Obstructive hypertrophic cardiomyopathy: Secondary | ICD-10-CM | POA: Diagnosis present

## 2015-08-03 DIAGNOSIS — J80 Acute respiratory distress syndrome: Secondary | ICD-10-CM | POA: Diagnosis present

## 2015-08-03 DIAGNOSIS — B9561 Methicillin susceptible Staphylococcus aureus infection as the cause of diseases classified elsewhere: Secondary | ICD-10-CM | POA: Insufficient documentation

## 2015-08-03 DIAGNOSIS — Z792 Long term (current) use of antibiotics: Secondary | ICD-10-CM | POA: Diagnosis not present

## 2015-08-03 DIAGNOSIS — K8020 Calculus of gallbladder without cholecystitis without obstruction: Secondary | ICD-10-CM | POA: Diagnosis present

## 2015-08-03 DIAGNOSIS — I442 Atrioventricular block, complete: Secondary | ICD-10-CM | POA: Diagnosis not present

## 2015-08-03 DIAGNOSIS — I10 Essential (primary) hypertension: Secondary | ICD-10-CM | POA: Diagnosis present

## 2015-08-03 DIAGNOSIS — I5032 Chronic diastolic (congestive) heart failure: Secondary | ICD-10-CM | POA: Diagnosis present

## 2015-08-03 DIAGNOSIS — Z888 Allergy status to other drugs, medicaments and biological substances status: Secondary | ICD-10-CM | POA: Diagnosis not present

## 2015-08-03 DIAGNOSIS — I480 Paroxysmal atrial fibrillation: Secondary | ICD-10-CM | POA: Diagnosis present

## 2015-08-03 DIAGNOSIS — Z66 Do not resuscitate: Secondary | ICD-10-CM | POA: Diagnosis present

## 2015-08-03 DIAGNOSIS — I248 Other forms of acute ischemic heart disease: Secondary | ICD-10-CM | POA: Diagnosis present

## 2015-08-03 DIAGNOSIS — I509 Heart failure, unspecified: Secondary | ICD-10-CM

## 2015-08-03 DIAGNOSIS — R296 Repeated falls: Secondary | ICD-10-CM | POA: Diagnosis present

## 2015-08-03 DIAGNOSIS — I4891 Unspecified atrial fibrillation: Secondary | ICD-10-CM | POA: Diagnosis present

## 2015-08-03 DIAGNOSIS — J9601 Acute respiratory failure with hypoxia: Secondary | ICD-10-CM | POA: Diagnosis present

## 2015-08-03 DIAGNOSIS — A419 Sepsis, unspecified organism: Secondary | ICD-10-CM

## 2015-08-03 DIAGNOSIS — T827XXA Infection and inflammatory reaction due to other cardiac and vascular devices, implants and grafts, initial encounter: Secondary | ICD-10-CM | POA: Diagnosis present

## 2015-08-03 DIAGNOSIS — Z881 Allergy status to other antibiotic agents status: Secondary | ICD-10-CM | POA: Diagnosis not present

## 2015-08-03 DIAGNOSIS — R0602 Shortness of breath: Secondary | ICD-10-CM

## 2015-08-03 DIAGNOSIS — A4101 Sepsis due to Methicillin susceptible Staphylococcus aureus: Secondary | ICD-10-CM | POA: Diagnosis present

## 2015-08-03 DIAGNOSIS — R7881 Bacteremia: Secondary | ICD-10-CM | POA: Insufficient documentation

## 2015-08-03 DIAGNOSIS — I5031 Acute diastolic (congestive) heart failure: Secondary | ICD-10-CM | POA: Diagnosis not present

## 2015-08-03 DIAGNOSIS — A4102 Sepsis due to Methicillin resistant Staphylococcus aureus: Secondary | ICD-10-CM | POA: Diagnosis not present

## 2015-08-03 DIAGNOSIS — F329 Major depressive disorder, single episode, unspecified: Secondary | ICD-10-CM | POA: Diagnosis present

## 2015-08-03 DIAGNOSIS — A408 Other streptococcal sepsis: Secondary | ICD-10-CM | POA: Diagnosis not present

## 2015-08-03 DIAGNOSIS — R0600 Dyspnea, unspecified: Secondary | ICD-10-CM | POA: Diagnosis not present

## 2015-08-03 LAB — URINALYSIS, ROUTINE W REFLEX MICROSCOPIC
BILIRUBIN URINE: NEGATIVE
GLUCOSE, UA: 100 mg/dL — AB
KETONES UR: NEGATIVE mg/dL
Leukocytes, UA: NEGATIVE
Nitrite: NEGATIVE
PROTEIN: 30 mg/dL — AB
Specific Gravity, Urine: 1.025 (ref 1.005–1.030)
pH: 5 (ref 5.0–8.0)

## 2015-08-03 LAB — TSH: TSH: 0.331 u[IU]/mL — AB (ref 0.350–4.500)

## 2015-08-03 LAB — CBC
HEMATOCRIT: 31.5 % — AB (ref 36.0–46.0)
HEMOGLOBIN: 10.6 g/dL — AB (ref 12.0–15.0)
MCH: 30.7 pg (ref 26.0–34.0)
MCHC: 33.7 g/dL (ref 30.0–36.0)
MCV: 91.3 fL (ref 78.0–100.0)
Platelets: 144 10*3/uL — ABNORMAL LOW (ref 150–400)
RBC: 3.45 MIL/uL — AB (ref 3.87–5.11)
RDW: 14.2 % (ref 11.5–15.5)
WBC: 20.5 10*3/uL — AB (ref 4.0–10.5)

## 2015-08-03 LAB — POCT I-STAT 3, ART BLOOD GAS (G3+)
ACID-BASE DEFICIT: 2 mmol/L (ref 0.0–2.0)
Bicarbonate: 21 mEq/L (ref 20.0–24.0)
O2 SAT: 97 %
TCO2: 22 mmol/L (ref 0–100)
pCO2 arterial: 29.4 mmHg — ABNORMAL LOW (ref 35.0–45.0)
pH, Arterial: 7.461 — ABNORMAL HIGH (ref 7.350–7.450)
pO2, Arterial: 83 mmHg (ref 80.0–100.0)

## 2015-08-03 LAB — CBC WITH DIFFERENTIAL/PLATELET
BASOS ABS: 0 10*3/uL (ref 0.0–0.1)
Basophils Relative: 0 %
EOS PCT: 0 %
Eosinophils Absolute: 0 10*3/uL (ref 0.0–0.7)
HCT: 29.6 % — ABNORMAL LOW (ref 36.0–46.0)
Hemoglobin: 10.2 g/dL — ABNORMAL LOW (ref 12.0–15.0)
LYMPHS PCT: 2 %
Lymphs Abs: 0.5 10*3/uL — ABNORMAL LOW (ref 0.7–4.0)
MCH: 31.6 pg (ref 26.0–34.0)
MCHC: 34.5 g/dL (ref 30.0–36.0)
MCV: 91.6 fL (ref 78.0–100.0)
MONO ABS: 2.6 10*3/uL — AB (ref 0.1–1.0)
Monocytes Relative: 12 %
Neutro Abs: 17.6 10*3/uL — ABNORMAL HIGH (ref 1.7–7.7)
Neutrophils Relative %: 86 %
PLATELETS: 140 10*3/uL — AB (ref 150–400)
RBC: 3.23 MIL/uL — ABNORMAL LOW (ref 3.87–5.11)
RDW: 14.3 % (ref 11.5–15.5)
WBC: 20.7 10*3/uL — ABNORMAL HIGH (ref 4.0–10.5)

## 2015-08-03 LAB — BRAIN NATRIURETIC PEPTIDE: B NATRIURETIC PEPTIDE 5: 3242.4 pg/mL — AB (ref 0.0–100.0)

## 2015-08-03 LAB — MAGNESIUM: MAGNESIUM: 1.6 mg/dL — AB (ref 1.7–2.4)

## 2015-08-03 LAB — PROTIME-INR
INR: 2.24 — ABNORMAL HIGH (ref 0.00–1.49)
PROTHROMBIN TIME: 24.6 s — AB (ref 11.6–15.2)

## 2015-08-03 LAB — BASIC METABOLIC PANEL
Anion gap: 10 (ref 5–15)
BUN: 38 mg/dL — AB (ref 6–20)
CALCIUM: 8.8 mg/dL — AB (ref 8.9–10.3)
CO2: 19 mmol/L — ABNORMAL LOW (ref 22–32)
Chloride: 96 mmol/L — ABNORMAL LOW (ref 101–111)
Creatinine, Ser: 1.56 mg/dL — ABNORMAL HIGH (ref 0.44–1.00)
GFR calc Af Amer: 33 mL/min — ABNORMAL LOW (ref >60)
GFR, EST NON AFRICAN AMERICAN: 29 mL/min — AB (ref >60)
GLUCOSE: 203 mg/dL — AB (ref 65–99)
POTASSIUM: 4.4 mmol/L (ref 3.5–5.1)
Sodium: 125 mmol/L — ABNORMAL LOW (ref 135–145)

## 2015-08-03 LAB — LACTIC ACID, PLASMA
LACTIC ACID, VENOUS: 2.7 mmol/L — AB (ref 0.5–2.0)
LACTIC ACID, VENOUS: 2.6 mmol/L — AB (ref 0.5–2.0)

## 2015-08-03 LAB — MRSA PCR SCREENING: MRSA BY PCR: POSITIVE — AB

## 2015-08-03 LAB — I-STAT CG4 LACTIC ACID, ED: Lactic Acid, Venous: 3.58 mmol/L (ref 0.5–2.0)

## 2015-08-03 LAB — GLUCOSE, CAPILLARY: Glucose-Capillary: 157 mg/dL — ABNORMAL HIGH (ref 65–99)

## 2015-08-03 LAB — CREATININE, SERUM
Creatinine, Ser: 1.48 mg/dL — ABNORMAL HIGH (ref 0.44–1.00)
GFR, EST AFRICAN AMERICAN: 35 mL/min — AB (ref >60)
GFR, EST NON AFRICAN AMERICAN: 30 mL/min — AB (ref >60)

## 2015-08-03 LAB — I-STAT TROPONIN, ED: TROPONIN I, POC: 0.55 ng/mL — AB (ref 0.00–0.08)

## 2015-08-03 LAB — URINE MICROSCOPIC-ADD ON

## 2015-08-03 LAB — PHOSPHORUS: PHOSPHORUS: 2.3 mg/dL — AB (ref 2.5–4.6)

## 2015-08-03 LAB — TROPONIN I
TROPONIN I: 0.54 ng/mL — AB (ref <0.031)
Troponin I: 0.38 ng/mL — ABNORMAL HIGH (ref <0.031)

## 2015-08-03 LAB — PROCALCITONIN: PROCALCITONIN: 0.25 ng/mL

## 2015-08-03 MED ORDER — FLUTICASONE PROPIONATE 50 MCG/ACT NA SUSP
1.0000 | Freq: Every day | NASAL | Status: DC
  Administered 2015-08-04 – 2015-08-05 (×2): 1 via NASAL
  Filled 2015-08-03: qty 16

## 2015-08-03 MED ORDER — PIPERACILLIN-TAZOBACTAM 3.375 G IVPB 30 MIN
3.3750 g | Freq: Once | INTRAVENOUS | Status: DC
  Filled 2015-08-03: qty 50

## 2015-08-03 MED ORDER — SALINE SPRAY 0.65 % NA SOLN
1.0000 | Freq: Four times a day (QID) | NASAL | Status: DC
  Administered 2015-08-03 – 2015-08-05 (×7): 1 via NASAL
  Filled 2015-08-03: qty 44

## 2015-08-03 MED ORDER — SODIUM CHLORIDE 0.9 % IV SOLN
Freq: Once | INTRAVENOUS | Status: AC
  Administered 2015-08-03: 17:00:00 via INTRAVENOUS

## 2015-08-03 MED ORDER — SODIUM CHLORIDE 0.9 % IJ SOLN
3.0000 mL | Freq: Two times a day (BID) | INTRAMUSCULAR | Status: DC
  Administered 2015-08-05: 3 mL via INTRAVENOUS

## 2015-08-03 MED ORDER — LORATADINE 10 MG PO TABS
10.0000 mg | ORAL_TABLET | Freq: Every day | ORAL | Status: DC
  Administered 2015-08-04: 10 mg via ORAL
  Filled 2015-08-03: qty 1

## 2015-08-03 MED ORDER — VANCOMYCIN HCL IN DEXTROSE 1-5 GM/200ML-% IV SOLN: 1000.0000 mg | Freq: Once | INTRAVENOUS | Status: DC

## 2015-08-03 MED ORDER — LEVALBUTEROL HCL 0.63 MG/3ML IN NEBU: 0.6300 mg | INHALATION_SOLUTION | Freq: Four times a day (QID) | RESPIRATORY_TRACT | Status: DC | PRN

## 2015-08-03 MED ORDER — SODIUM CHLORIDE 0.9 % IJ SOLN
3.0000 mL | Freq: Two times a day (BID) | INTRAMUSCULAR | Status: DC
Start: 2015-08-03 — End: 2015-08-05
  Administered 2015-08-04: 3 mL via INTRAVENOUS

## 2015-08-03 MED ORDER — HEPARIN SODIUM (PORCINE) 5000 UNIT/ML IJ SOLN: 5000.0000 [IU] | Freq: Three times a day (TID) | INTRAMUSCULAR | Status: DC

## 2015-08-03 MED ORDER — ACETAMINOPHEN 325 MG PO TABS
650.0000 mg | ORAL_TABLET | Freq: Four times a day (QID) | ORAL | Status: DC | PRN
  Administered 2015-08-04: 650 mg via ORAL

## 2015-08-03 MED ORDER — ONDANSETRON HCL 4 MG/2ML IJ SOLN: 4.0000 mg | Freq: Four times a day (QID) | INTRAMUSCULAR | Status: DC | PRN

## 2015-08-03 MED ORDER — OSELTAMIVIR PHOSPHATE 30 MG PO CAPS: 30.0000 mg | ORAL_CAPSULE | Freq: Two times a day (BID) | ORAL | Status: DC

## 2015-08-03 MED ORDER — LATANOPROST 0.005 % OP SOLN
1.0000 [drp] | Freq: Every day | OPHTHALMIC | Status: DC
  Administered 2015-08-03 – 2015-08-05 (×2): 1 [drp] via OPHTHALMIC
  Filled 2015-08-03 (×2): qty 2.5

## 2015-08-03 MED ORDER — GUAIFENESIN-DM 100-10 MG/5ML PO SYRP: 5.0000 mL | ORAL_SOLUTION | ORAL | Status: DC | PRN

## 2015-08-03 MED ORDER — VANCOMYCIN HCL 500 MG IV SOLR
500.0000 mg | Freq: Two times a day (BID) | INTRAVENOUS | Status: DC
  Filled 2015-08-03: qty 500

## 2015-08-03 MED ORDER — ESCITALOPRAM OXALATE 10 MG PO TABS
10.0000 mg | ORAL_TABLET | Freq: Every day | ORAL | Status: DC
  Administered 2015-08-04: 10 mg via ORAL
  Filled 2015-08-03: qty 1

## 2015-08-03 MED ORDER — PIPERACILLIN-TAZOBACTAM 3.375 G IVPB 30 MIN
3.3750 g | Freq: Once | INTRAVENOUS | Status: AC
  Administered 2015-08-03: 3.375 g via INTRAVENOUS

## 2015-08-03 MED ORDER — HYDROMORPHONE HCL 1 MG/ML IJ SOLN
0.5000 mg | INTRAMUSCULAR | Status: DC | PRN
  Administered 2015-08-04 – 2015-08-05 (×6): 0.5 mg via INTRAVENOUS
  Filled 2015-08-03 (×7): qty 1

## 2015-08-03 MED ORDER — SODIUM CHLORIDE 0.9 % IJ SOLN: 3.0000 mL | INTRAMUSCULAR | Status: DC | PRN

## 2015-08-03 MED ORDER — PANTOPRAZOLE SODIUM 40 MG PO TBEC
40.0000 mg | DELAYED_RELEASE_TABLET | Freq: Two times a day (BID) | ORAL | Status: DC
  Administered 2015-08-03 – 2015-08-04 (×2): 40 mg via ORAL
  Filled 2015-08-03 (×3): qty 1

## 2015-08-03 MED ORDER — PIPERACILLIN-TAZOBACTAM 3.375 G IVPB 30 MIN: 3.3750 g | Freq: Once | INTRAVENOUS | Status: DC

## 2015-08-03 MED ORDER — PIPERACILLIN-TAZOBACTAM 3.375 G IVPB
3.3750 g | Freq: Three times a day (TID) | INTRAVENOUS | Status: DC
  Administered 2015-08-03 – 2015-08-05 (×5): 3.375 g via INTRAVENOUS
  Filled 2015-08-03 (×8): qty 50

## 2015-08-03 MED ORDER — OSELTAMIVIR PHOSPHATE 30 MG PO CAPS
30.0000 mg | ORAL_CAPSULE | Freq: Every day | ORAL | Status: DC
  Filled 2015-08-03: qty 1

## 2015-08-03 MED ORDER — ACETAMINOPHEN 650 MG RE SUPP: 650.0000 mg | Freq: Four times a day (QID) | RECTAL | Status: DC | PRN

## 2015-08-03 MED ORDER — SODIUM CHLORIDE 0.9 % IV SOLN: 250.0000 mL | INTRAVENOUS | Status: DC | PRN

## 2015-08-03 MED ORDER — AMIODARONE HCL 200 MG PO TABS
200.0000 mg | ORAL_TABLET | Freq: Every day | ORAL | Status: DC
  Administered 2015-08-04: 200 mg via ORAL
  Filled 2015-08-03: qty 1

## 2015-08-03 MED ORDER — ACETAMINOPHEN 325 MG PO TABS
650.0000 mg | ORAL_TABLET | Freq: Three times a day (TID) | ORAL | Status: DC
  Administered 2015-08-03: 650 mg via ORAL
  Filled 2015-08-03 (×3): qty 2

## 2015-08-03 MED ORDER — VANCOMYCIN HCL IN DEXTROSE 1-5 GM/200ML-% IV SOLN
1000.0000 mg | Freq: Once | INTRAVENOUS | Status: AC
  Administered 2015-08-03: 1000 mg via INTRAVENOUS
  Filled 2015-08-03: qty 200

## 2015-08-03 MED ORDER — SODIUM CHLORIDE 0.9 % IV BOLUS (SEPSIS): 500.0000 mL | Freq: Once | INTRAVENOUS | Status: DC

## 2015-08-03 MED ORDER — IPRATROPIUM-ALBUTEROL 0.5-2.5 (3) MG/3ML IN SOLN
3.0000 mL | Freq: Four times a day (QID) | RESPIRATORY_TRACT | Status: DC
  Administered 2015-08-03 – 2015-08-04 (×2): 3 mL via RESPIRATORY_TRACT
  Filled 2015-08-03 (×4): qty 3

## 2015-08-03 MED ORDER — SODIUM CHLORIDE 0.9 % IV SOLN
INTRAVENOUS | Status: AC
  Administered 2015-08-04: 04:00:00 via INTRAVENOUS

## 2015-08-03 MED ORDER — ONDANSETRON HCL 4 MG PO TABS: 4.0000 mg | ORAL_TABLET | Freq: Four times a day (QID) | ORAL | Status: DC | PRN

## 2015-08-03 MED ORDER — ACETAMINOPHEN ER 650 MG PO TBCR: 650.0000 mg | EXTENDED_RELEASE_TABLET | Freq: Three times a day (TID) | ORAL | Status: DC

## 2015-08-03 MED ORDER — OSELTAMIVIR PHOSPHATE 30 MG PO CAPS
30.0000 mg | ORAL_CAPSULE | Freq: Once | ORAL | Status: AC
  Administered 2015-08-03: 30 mg via ORAL
  Filled 2015-08-03: qty 1

## 2015-08-03 NOTE — Progress Notes (Signed)
CRITICAL VALUE ALERT  Critical value received: trop 0.54  Date of notification:  05-30-2015  Time of notification:  1910  Critical value read back:yes  Nurse who received alert:  Elberta Fortisaroline Schenck  MD notified (1st page):  Dr. Maurine CaneKinley  Time of first page: 1913  MD notified (2nd page):  Time of second page:  Dr.Kinley MD  Time MD responded:  [phone]

## 2015-08-03 NOTE — Progress Notes (Signed)
CRITICAL VALUE ALERT  Critical value received:  Lactic acid 2.7  Date of notification:  07/23/2015  Time of notification:  1855  Critical value read back:yes  Nurse who received alert:  Elberta Fortisaroline Schenck RN  MD notified (1st page): Dr.Abrol  Time of first page:  1855  MD notified (2nd page):  Time of second page:  Responding MD: Dr. Maurine Canekinley   Time MD responded: [phone]

## 2015-08-03 NOTE — ED Notes (Signed)
Placed pt on non rebreather 

## 2015-08-03 NOTE — ED Notes (Signed)
Attempted report x1. 

## 2015-08-03 NOTE — ED Provider Notes (Signed)
CSN: [id number]     Arrival date & time 07/25/2015  1517 History   First MD Initiated Contact with Patient 07/13/2015 1518     Chief Complaint  Patient presents with  . Shortness of Breath     (Consider location/radiation/quality/duration/timing/severity/associated sxs/prior Treatment) HPI Comments: Sent from nursing home for fevers, chills, sore throat, and shortness of breath the past 2 days. She denies headache, chest pain, diaphoresis. Previous treated 1 month ago for pneumonia. She was started on Levaquin yesterday and nursing home without improvement. History of complete heart block and pacemaker. On anticoagulation.   Patient is a 81 y.o. female presenting with shortness of breath.  Shortness of Breath Severity:  Moderate Onset quality:  Gradual Duration:  2 days Timing:  Constant Progression:  Worsening Chronicity:  New Context: activity and URI   Relieved by:  Oxygen Worsened by:  Activity and movement Associated symptoms: fever, sore throat and syncope   Associated symptoms: no abdominal pain, no chest pain, no claudication, no cough, no diaphoresis, no headaches, no PND, no rash, no sputum production, no vomiting and no wheezing   Fever:    Duration:  2 days   Timing:  Intermittent   Temp source:  Oral Risk factors: no recent alcohol use, no hx of PE/DVT and no obesity     Past Medical History  Diagnosis Date  . Anxiety and depression   . Arthritis   . Atrial fibrillation (HCC)     a. amiodarone  . Hypertension   . Hyperlipemia   . Diverticulitis   . Esophageal stricture   . Complete heart block [facility])     s/p dual chamber PPM implantation April 2015   Past Surgical History  Procedure Laterality Date  . Appendectomy    . Cholecystectomy    . Abdominal hysterectomy    . Pacemaker insertion  12-28-2013    MDT dual chamber pacemaker implanted by Dr Ladona Ridgel for CHB  . Left heart catheterization with coronary angiogram N/A 12/27/2013    Procedure: LEFT HEART  CATHETERIZATION WITH CORONARY ANGIOGRAM;  Surgeon: Corky Crafts, MD;  Location: [facility] CATH LAB;  Service: Cardiovascular;  Laterality: N/A;  . Permanent pacemaker insertion N/A 12/28/2013    Procedure: PERMANENT PACEMAKER INSERTION;  Surgeon: Marinus Maw, MD;  Location: [facility] CATH LAB;  Service: Cardiovascular;  Laterality: N/A;   History reviewed. No pertinent family history. Social History  Substance Use Topics  . Smoking status: Never Smoker   . Smokeless tobacco: Never Used  . Alcohol Use: Yes   OB History    No data available     Review of Systems  Constitutional: Positive for fever, chills, activity change and fatigue. Negative for diaphoresis and appetite change.  HENT: Positive for sore throat. Negative for congestion, postnasal drip and rhinorrhea.   Eyes: Negative.   Respiratory: Positive for shortness of breath. Negative for cough, sputum production, chest tightness, wheezing and stridor.   Cardiovascular: Positive for syncope. Negative for chest pain, palpitations, claudication, leg swelling and PND.  Gastrointestinal: Positive for constipation. Negative for nausea, vomiting, abdominal pain and diarrhea.  Genitourinary: Positive for dysuria. Negative for urgency and difficulty urinating.  Musculoskeletal: Positive for myalgias. Negative for arthralgias.  Skin: Negative for rash.  Neurological: Positive for weakness. Negative for dizziness, syncope, numbness and headaches.      Allergies  Ativan and Sulfa antibiotics  Home Medications   Prior to Admission medications   Medication Sig Start Date End Date Taking? Authorizing Provider  acetaminophen (TYLENOL)  650 MG CR tablet Take 650 mg by mouth every 8 (eight) hours.    Yes Historical Provider, MD  amiodarone (PACERONE) 200 MG tablet Take 1 tablet (200 mg total) by mouth daily. 12/30/13  Yes Brooke O Edmisten, PA-C  calcium carbonate (OS-CAL) 600 MG TABS tablet Take 600 mg by mouth at bedtime.    Yes Historical  Provider, MD  cyanocobalamin (,VITAMIN B-12,) 1000 MCG/ML injection Inject 1,000 mcg into the muscle every 30 (thirty) days.   Yes Historical Provider, MD  escitalopram (LEXAPRO) 10 MG tablet Take 10 mg by mouth daily.   Yes Historical Provider, MD  ezetimibe (ZETIA) 10 MG tablet Take 10 mg by mouth at bedtime.    Yes Historical Provider, MD  fluticasone (FLONASE) 50 MCG/ACT nasal spray Place 1 spray into both nostrils daily. 01/07/15  Yes Historical Provider, MD  guaiFENesin-dextromethorphan (ROBITUSSIN DM) 100-10 MG/5ML syrup Take 5 mLs by mouth every 4 (four) hours as needed for cough.   Yes Historical Provider, MD  ipratropium-albuterol (DUONEB) 0.5-2.5 (3) MG/3ML SOLN Take 3 mLs by nebulization every 6 (six) hours.   Yes Historical Provider, MD  latanoprost (XALATAN) 0.005 % ophthalmic solution Place 1 drop into both eyes at bedtime.   Yes Historical Provider, MD  levofloxacin (LEVAQUIN) 250 MG tablet Take 250 mg by mouth daily.   Yes Historical Provider, MD  loratadine (CLARITIN) 10 MG tablet Take 10 mg by mouth daily.   Yes Historical Provider, MD  Menthol, Topical Analgesic, (BIOFREEZE) 4 % GEL Apply 1 application topically every 8 (eight) hours as needed.   Yes Historical Provider, MD  mirtazapine (REMERON) 15 MG tablet Take 7.5 mg by mouth at bedtime.    Yes Historical Provider, MD  pantoprazole (PROTONIX) 40 MG tablet Take 40 mg by mouth 2 (two) times daily.   Yes Historical Provider, MD  sennosides-docusate sodium (SENOKOT-S) 8.6-50 MG tablet Take 1 tablet by mouth 2 (two) times daily.   Yes Historical Provider, MD  sodium chloride (OCEAN) 0.65 % SOLN nasal spray Place 1 spray into both nostrils 4 (four) times daily.    Yes Historical Provider, MD  Vitamin D, Ergocalciferol, (DRISDOL) 50000 UNITS CAPS capsule Take 50,000 Units by mouth every 30 (thirty) days.   Yes Historical Provider, MD   BP 128/65 mmHg  Pulse 94  Temp(Src) 99.3 F (37.4 C) (Oral)  Resp 22  Ht 5' 6.14" (1.68 m)   Wt 54.432 kg  BMI 19.29 kg/m2  SpO2 97% Physical Exam  Constitutional:  Elderlyail   HENT:  Right Ear: External ear normal.  Left Ear: External ear normal.  Eyes: Conjunctivae and EOM are normal. Pupils are equal, round, and reactive to light.  Neck: Normal range of motion. Neck supple. No JVD present.  Cardiovascular: Intact distal pulses.   Tachycardia, 3/6 systolic murmur  Pulmonary/Chest: She has no wheezes. She has rales. She exhibits no tenderness.  Tachypnea on [place] oxygen;   Abdominal: Soft. She exhibits no distension. There is no tenderness.  Lymphadenopathy:    She has no cervical adenopathy.  Neurological: She is alert. No cranial nerve deficit. She exhibits normal muscle tone. Coordination normal.  Oriented to person but not place or time (baseline per daughter)  Skin: Skin is warm. No rash noted. No pallor.    ED Course  Procedures (including critical care time) Labs Review Labs Reviewed  URINALYSIS, ROUTINE W REFLEX MICROSCOPIC (NOT AT [facility]) - Abnormal; Notable for the following:    APPearance CLOUDY (*)    Glucose,  UA 100 (*)    Hgb urine dipstick MODERATE (*)    Protein, ur 30 (*)    All other components within normal limits  BASIC METABOLIC PANEL - Abnormal; Notable for the following:    Sodium 125 (*)    Chloride 96 (*)    CO2 19 (*)    Glucose, Bld 203 (*)    BUN 38 (*)    Creatinine, Ser 1.56 (*)    Calcium 8.8 (*)    GFR calc non Af Amer 29 (*)    GFR calc Af Amer 33 (*)    All other components within normal limits  CBC WITH DIFFERENTIAL/PLATELET - Abnormal; Notable for the following:    WBC 20.7 (*)    RBC 3.23 (*)    Hemoglobin 10.2 (*)    HCT 29.6 (*)    Platelets 140 (*)    Neutro Abs 17.6 (*)    Lymphs Abs 0.5 (*)    Monocytes Absolute 2.6 (*)    All other components within normal limits  BRAIN NATRIURETIC PEPTIDE - Abnormal; Notable for the following:    B Natriuretic Peptide 3242.4 (*)    All other components within normal limits   MAGNESIUM - Abnormal; Notable for the following:    Magnesium 1.6 (*)    All other components within normal limits  LACTIC ACID, PLASMA - Abnormal; Notable for the following:    Lactic Acid, Venous 2.7 (*)    All other components within normal limits  PROTIME-INR - Abnormal; Notable for the following:    Prothrombin Time 24.6 (*)    INR 2.24 (*)    All other components within normal limits  CBC - Abnormal; Notable for the following:    WBC 20.5 (*)    RBC 3.45 (*)    Hemoglobin 10.6 (*)    HCT 31.5 (*)    Platelets 144 (*)    All other components within normal limits  CREATININE, SERUM - Abnormal; Notable for the following:    Creatinine, Ser 1.48 (*)    GFR calc non Af Amer 30 (*)    GFR calc Af Amer 35 (*)    All other components within normal limits  PHOSPHORUS - Abnormal; Notable for the following:    Phosphorus 2.3 (*)    All other components within normal limits  TROPONIN I - Abnormal; Notable for the following:    Troponin I 0.54 (*)    All other components within normal limits  URINE MICROSCOPIC-ADD ON - Abnormal; Notable for the following:    Squamous Epithelial / LPF 0-5 (*)    Bacteria, UA MANY (*)    All other components within normal limits  I-STAT CG4 LACTIC ACID, ED - Abnormal; Notable for the following:    Lactic Acid, Venous 3.58 (*)    All other components within normal limits  I-STAT TROPOININ, ED - Abnormal; Notable for the following:    Troponin i, poc 0.55 (*)    All other components within normal limits  CULTURE, BLOOD (ROUTINE X 2)  URINE CULTURE  CULTURE, BLOOD (ROUTINE X 2)  MRSA PCR SCREENING  INFLUENZA PANEL BY PCR (TYPE A & B, H1N1)  LACTIC ACID, PLASMA  PROCALCITONIN  TSH  TROPONIN I  TROPONIN I  BLOOD GAS, ARTERIAL    Imaging Review Dg Chest Port 1 View  07/20/2015  CLINICAL DATA:  Shortness of breath.  Sore throat.  Hypoxia. EXAM: PORTABLE CHEST 1 VIEW COMPARISON:  12/29/2013 FINDINGS: New prominently reverse  lordotic projection  likely due to kyphosis. Dual lead pacer in place, although distal lead tip is below the inferior margin of today's image. Moderate enlargement of the cardiopericardial silhouette. Atherosclerotic aortic arch. Patchy bilateral airspace opacities especially in the perihilar and basilar regions. Indistinct obscuration of the hemidiaphragms, right greater the left. IMPRESSION: 1. Indistinct vasculature with bilateral airspace opacities and enlargement of the cardiopericardial silhouette, favoring acute pulmonary edema. 2. Atherosclerotic aortic arch. 3. Dual lead pacer in place. 4. Cannot exclude layering pleural effusions. Electronically Signed   By: Gaylyn Rong M.D.   On: 07/21/2015 16:50   I have personally reviewed and evaluated these images and lab results as part of my medical decision-making.   EKG Interpretation   Date/Time:  Wednesday August 03 2015 15:25:58 EST Ventricular Rate:  103 PR Interval:  94 QRS Duration: 216 QT Interval:  531 QTC Calculation: 695 R Axis:   -48 Text Interpretation:  Ventricular-paced rhythm No further analysis  attempted due to paced rhythm Abnormal ekg Confirmed by BEATON  MD, ROBERT  ([id number]) on 07/29/2015 5:42:47 PM      MDM   Final diagnoses:  Sepsis, due to unspecified organism (HCC)  Congestive heart failure, unspecified congestive heart failure chronicity, unspecified congestive heart failure type (HCC)  SOB (shortness of breath)    81 year old female presents with shortness of breath has been progressive for the past 2 days. History of complete heart block and pacemaker. For the fevers and chills at nursing home prior to arrival. Blood work revealed elevated WBC and lactic acid concerning for sepsis. Chest x-ray with bilateral airspace opacities concerning for pulmonary edema verse pneumonia. BNP elevated. Troponin mildly elevated but EKG without acute changes, most likely demand ischemia. Called for admission for likely CHF exacerbation and  possible pneumonia / sepsis.   Jamal Collin, MD 08/02/2015, 7:30 PM PGY-3, [facility]      Jamal Collin, MD 07/22/2015 Terence Lux  Nelva Nay, MD 07/19/2015 [phone]

## 2015-08-03 NOTE — Progress Notes (Addendum)
RN, Inetta Fermoina, paged NP about CT scans ordered for tonight. Pt had hypoxemia on admission due to CHF vs PNA. Pt is presently on NRB and is unable to lie flat for exams. Her O2 sat is fine on NRB and the ABG drawn tonight on the NRB looks good. Pt is not as tachypneic as before. RN says pt's RR is around 25 and she is not working hard to breathe.  Plan discussed with Tina-should pt require bipap tonight, we will at least obtain CT head given her fall before admission. At present, we will defer the CTs secondary to her respiratory status. Given unknown sequelae of head trauma at this time, will hold Heparin sq for VTE prophylaxis and place SCDs. Monitor neuro status q2 x 4 then q4.  Jimmye NormanKaren Kirby-Graham, NP [facility] Addendum: Pt is now on Bipap. As soon as she gets settled, we can send her for CT head. Will r/p ABG in 2 hours. Foley for acute urinary retention. Slow IVF to 50/hr.  KJKG, NP [facility]

## 2015-08-03 NOTE — Progress Notes (Signed)
RN states that pt. [facility] been [facility]. RT for [facility] was called & told about BIPAP & ABG orders.

## 2015-08-03 NOTE — Progress Notes (Signed)
ANTIBIOTIC CONSULT NOTE - INITIAL  Pharmacy Consult for Vancomycin and Zosyn Indication: rule out sepsis  Allergies  Allergen Reactions  . Ativan [Lorazepam] Other (See Comments)    Extreme delirium  . Sulfa Antibiotics     unknown    Patient Measurements: Height: 5' 6.14" (168 cm) Weight: 120 lb (54.432 kg) IBW/kg (Calculated) : 59.63 Adjusted Body Weight:   Vital Signs: Temp: 99.3 F (37.4 C) (11/23 1526) Temp Source: Oral (11/23 1526) BP: 140/47 mmHg (11/23 1530) Pulse Rate: 103 (11/23 1530) Intake/Output from previous day:   Intake/Output from this shift:    Labs:  Recent Labs  07/21/2015 1547  WBC 20.7*  HGB 10.2*  PLT 140*   CrCl cannot be calculated (Unknown ideal weight.). No results for input(s): VANCOTROUGH, VANCOPEAK, VANCORANDOM, GENTTROUGH, GENTPEAK, GENTRANDOM, TOBRATROUGH, TOBRAPEAK, TOBRARND, AMIKACINPEAK, AMIKACINTROU, AMIKACIN in the last 72 hours.   Microbiology: No results found for this or any previous visit (from the past 720 hour(s)).  Medical History: Past Medical History  Diagnosis Date  . Anxiety and depression   . Arthritis   . Atrial fibrillation (HCC)     a. amiodarone  . Hypertension   . Hyperlipemia   . Diverticulitis   . Esophageal stricture   . Complete heart block [facility](HCC)     s/p dual chamber PPM implantation April 2015    Medications:   (Not in a hospital admission) Scheduled:   Infusions:  . piperacillin-tazobactam    . vancomycin     Assessment: 81yo female with history of PAF, HTN, HLD and CKD3 presents with SOB for 2 days. Pharmacy is consulted to dose vancomycin and zosyn for suspected sepsis. Tmax 99.3, WBC 20.7, sCr 1.56. Pt received levaquin yesterday.   Pt received vancomycin 1g and zosyn 3.375g IV once in the ED.  Goal of Therapy:  Vancomycin trough level 15-20 mcg/ml  Plan:  Vancomycin 1g IV once followed by 500mg  q24h Zosyn 3.375g IV q8h Measure antibiotic drug levels at steady state Follow up  culture results, renal function and clinical course  Arlean HoppingCorey M. Newman PiesBall, PharmD, BCPS Clinical Pharmacist Pager [phone] 07/24/2015,4:19 PM

## 2015-08-03 NOTE — ED Notes (Signed)
[facility]. Pt having SOB x2 days. Pt was started on levoquin for elevated WBC. Temp 104 yesterday.. EMS obtain room sats 82% on room air. Pt wears o2 all the time at facility. BP 119/74, HR 106. Pt was on nonrebreather for EMS. Sats 84% on 5L [place]

## 2015-08-03 NOTE — Progress Notes (Signed)
RN, Elita QuickPam, paged this NP with elevated troponin level. Her troponin was elevated in ED on admission. However, the pt has not had any chest pain. This was verified by phone by the day RN. Elevation thought to be demand ischemia from CHF as the pt is symptom free and has no acute EKG changes. Asked night RN to call if pt does c/o CP. Will follow.  Jimmye NormanKaren Kirby-Graham, NP [facility]

## 2015-08-03 NOTE — Progress Notes (Signed)
Collected ABG  PH-7.46 CO2 - 29 PaO2 - 83 BiCarb - 21  Bipap not placed on at this time. Patient is breathing fast but not in apparent distress and can complete sentences fully. RT will continue to monitor and keep Bipap on standby.

## 2015-08-03 NOTE — H&P (Signed)
[facility] History and Physical  Julie Serrano BJY:[id number] DOB: 16-Dec-1924 DOA: 07/20/2015  Referring physician:   PCP: Garvin Fila, MD   Chief Complaint: Sepsis   HPI:  81 year old female with a history of paroxysmal atrial fibrillation, hypertension, dyslipidemia, cholelithiasis with dilated common bile duct, complete heart block status post pacemaker followed by Dr. Ladona Ridgel of cardiology, currently at a long-term care facility, who presents to the ER for worsening shortness of breath since Sunday. Patient is on a nonrebreather and her daughter provides most of the history. Patient started developing generalized weakness and lightheadedness, and had a fall on Sunday, fell backwards and hit the back of her head. She had blood work done at the nursing home that revealed that the patient possibly have a UTI, started on Levaquin 250 mg a day on Sunday. However patient shortness of breath progressively got worse, associated with a productive cough. Brought in by EMS and was found to have a temperature of 104, oxygen saturation of 82% on room air, initial blood pressure 1 19 /74. Heart rate 106, 84% on 5 L. Patient subsequently placed on a nonrebreather. Fund of a sodium of 125, acute kidney injury with a creatinine of 1.56, glucose 203, [id number]. Troponin 0.55. Lactic acid 3.58. White blood cell count of 20.7. Patient is being admitted for sepsis to step down. Chest x-ray shows indistinct opacity, pneumonia versus CHF      Review of Systems: negative for the following  Constitutional: Positive for fever, chills, diaphoresis, appetite change and fatigue.  HEENT: Denies photophobia, eye pain, redness, hearing loss, ear pain, congestion, sore throat, rhinorrhea, sneezing, mouth sores, trouble swallowing, neck pain, neck stiffness and tinnitus.  Respiratory: Positive for SOB, DOE, cough, chest tightness, and wheezing.  Cardiovascular: Denies chest pain, palpitations and leg swelling.   Gastrointestinal: Denies nausea, vomiting, abdominal pain, diarrhea, constipation, blood in stool and abdominal distention.  Genitourinary: Denies dysuria, urgency, frequency, hematuria, flank pain and difficulty urinating.  Musculoskeletal: Denies myalgias, back pain, joint swelling, arthralgias and positive for gait problem.  Skin: Denies pallor, rash and wound.  Neurological: Positive for dizziness, seizures, syncope, weakness, light-headedness, numbness and headaches. Positive for falls Hematological: Denies adenopathy. Easy bruising, personal or family bleeding history  Psychiatric/Behavioral: Denies suicidal ideation, mood changes, confusion, nervousness, sleep disturbance and agitation       Past Medical History  Diagnosis Date  . Anxiety and depression   . Arthritis   . Atrial fibrillation (HCC)     a. amiodarone  . Hypertension   . Hyperlipemia   . Diverticulitis   . Esophageal stricture   . Complete heart block [facility])     s/p dual chamber PPM implantation April 2015     Past Surgical History  Procedure Laterality Date  . Appendectomy    . Cholecystectomy    . Abdominal hysterectomy    . Pacemaker insertion  12-28-2013    MDT dual chamber pacemaker implanted by Dr Ladona Ridgel for CHB  . Left heart catheterization with coronary angiogram N/A 12/27/2013    Procedure: LEFT HEART CATHETERIZATION WITH CORONARY ANGIOGRAM;  Surgeon: Corky Crafts, MD;  Location: [facility] CATH LAB;  Service: Cardiovascular;  Laterality: N/A;  . Permanent pacemaker insertion N/A 12/28/2013    Procedure: PERMANENT PACEMAKER INSERTION;  Surgeon: Marinus Maw, MD;  Location: [facility] CATH LAB;  Service: Cardiovascular;  Laterality: N/A;      Social History:  reports that she has never smoked. She has never used smokeless tobacco. She reports that she  drinks alcohol. She reports that she does not use illicit drugs.    Allergies  Allergen Reactions  . Ativan [Lorazepam] Other (See Comments)     Extreme delirium  . Sulfa Antibiotics     unknown        FAMILY HISTORY  When questioned  Directly-patient reports  No family history of HTN, CVA ,DIABETES, TB, Cancer CAD, Bleeding Disorders, Sickle Cell, diabetes, anemia, asthma,   Prior to Admission medications   Medication Sig Start Date End Date Taking? Authorizing Provider  acetaminophen (TYLENOL) 650 MG CR tablet Take 650 mg by mouth every 8 (eight) hours.    Yes Historical Provider, MD  amiodarone (PACERONE) 200 MG tablet Take 1 tablet (200 mg total) by mouth daily. 12/30/13  Yes Brooke O Edmisten, PA-C  calcium carbonate (OS-CAL) 600 MG TABS tablet Take 600 mg by mouth at bedtime.    Yes Historical Provider, MD  cyanocobalamin (,VITAMIN B-12,) 1000 MCG/ML injection Inject 1,000 mcg into the muscle every 30 (thirty) days.   Yes Historical Provider, MD  escitalopram (LEXAPRO) 10 MG tablet Take 10 mg by mouth daily.   Yes Historical Provider, MD  ezetimibe (ZETIA) 10 MG tablet Take 10 mg by mouth at bedtime.    Yes Historical Provider, MD  fluticasone (FLONASE) 50 MCG/ACT nasal spray Place 1 spray into both nostrils daily. 01/07/15  Yes Historical Provider, MD  guaiFENesin-dextromethorphan (ROBITUSSIN DM) 100-10 MG/5ML syrup Take 5 mLs by mouth every 4 (four) hours as needed for cough.   Yes Historical Provider, MD  ipratropium-albuterol (DUONEB) 0.5-2.5 (3) MG/3ML SOLN Take 3 mLs by nebulization every 6 (six) hours.   Yes Historical Provider, MD  latanoprost (XALATAN) 0.005 % ophthalmic solution Place 1 drop into both eyes at bedtime.   Yes Historical Provider, MD  levofloxacin (LEVAQUIN) 250 MG tablet Take 250 mg by mouth daily.   Yes Historical Provider, MD  loratadine (CLARITIN) 10 MG tablet Take 10 mg by mouth daily.   Yes Historical Provider, MD  Menthol, Topical Analgesic, (BIOFREEZE) 4 % GEL Apply 1 application topically every 8 (eight) hours as needed.   Yes Historical Provider, MD  mirtazapine (REMERON) 15 MG tablet Take 7.5 mg  by mouth at bedtime.    Yes Historical Provider, MD  pantoprazole (PROTONIX) 40 MG tablet Take 40 mg by mouth 2 (two) times daily.   Yes Historical Provider, MD  sennosides-docusate sodium (SENOKOT-S) 8.6-50 MG tablet Take 1 tablet by mouth 2 (two) times daily.   Yes Historical Provider, MD  sodium chloride (OCEAN) 0.65 % SOLN nasal spray Place 1 spray into both nostrils 4 (four) times daily.    Yes Historical Provider, MD  Vitamin D, Ergocalciferol, (DRISDOL) 50000 UNITS CAPS capsule Take 50,000 Units by mouth every 30 (thirty) days.   Yes Historical Provider, MD     Physical Exam: Filed Vitals:   07/14/2015 1645 08/04/2015 1715 08/08/2015 1730 08/02/2015 1745  BP: 134/89 143/71 121/99 138/66  Pulse: 101 99 98 97  Temp:      TempSrc:      Resp:  34 25 30  Height:      Weight:      SpO2: 93% 96% 97% 99%     Constitutional: Vital signs reviewed. Patient is a well-developed and well-nourished in no acute distress and cooperative with exam. Alert and oriented x3.  Head: Normocephalic and atraumatic  Ear: TM normal bilaterally  Mouth: no erythema or exudates, MMM  Eyes: PERRL, EOMI, conjunctivae normal, No scleral icterus.  Neck: Supple, Trachea midline normal ROM, No JVD, mass, thyromegaly, or carotid bruit present.   Tachycardia, 3/6 systolic murmur  Pulmonary/Chest: She has no wheezes. She has rales. She exhibits no tenderness.  Tachypnea on [place] oxygen;  Abdominal: Soft. She exhibits no distension. There is no tenderness.  Lymphadenopathy:   She has no cervical adenopathy.  Neurological: She is alert. No cranial nerve deficit. She exhibits normal muscle tone. Coordination normal.  Oriented to person but not place or time (baseline per daughter) Skin: Warm, dry and intact. No rash, cyanosis, or clubbing.  Psychiatric: Normal mood and affect. speech and behavior is normal. Judgment and thought content normal. Cognition and memory are normal.      Data Review   Micro Results Recent  Results (from the past 240 hour(s))  Culture, blood (routine x 2)     Status: None (Preliminary result)   Collection Time: 2015/08/22  3:59 PM  Result Value Ref Range Status   Specimen Description BLOOD LEFT ANTECUBITAL  Final   Special Requests BOTTLES DRAWN AEROBIC AND ANAEROBIC 5CC  Final   Culture PENDING  Incomplete   Report Status PENDING  Incomplete    Radiology Reports Dg Chest Port 1 View  August 22, 2015  CLINICAL DATA:  Shortness of breath.  Sore throat.  Hypoxia. EXAM: PORTABLE CHEST 1 VIEW COMPARISON:  12/29/2013 FINDINGS: New prominently reverse lordotic projection likely due to kyphosis. Dual lead pacer in place, although distal lead tip is below the inferior margin of today's image. Moderate enlargement of the cardiopericardial silhouette. Atherosclerotic aortic arch. Patchy bilateral airspace opacities especially in the perihilar and basilar regions. Indistinct obscuration of the hemidiaphragms, right greater the left. IMPRESSION: 1. Indistinct vasculature with bilateral airspace opacities and enlargement of the cardiopericardial silhouette, favoring acute pulmonary edema. 2. Atherosclerotic aortic arch. 3. Dual lead pacer in place. 4. Cannot exclude layering pleural effusions. Electronically Signed   By: Gaylyn Rong M.D.   On: 08-22-15 16:50     CBC  Recent Labs Lab Aug 22, 2015 1547  WBC 20.7*  HGB 10.2*  HCT 29.6*  PLT 140*  MCV 91.6  MCH 31.6  MCHC 34.5  RDW 14.3  LYMPHSABS 0.5*  MONOABS 2.6*  EOSABS 0.0  BASOSABS 0.0    Chemistries   Recent Labs Lab 08-22-2015 1547  NA 125*  K 4.4  CL 96*  CO2 19*  GLUCOSE 203*  BUN 38*  CREATININE 1.56*  CALCIUM 8.8*   ------------------------------------------------------------------------------------------------------------------ estimated creatinine clearance is 21.4 mL/min (by C-G formula based on Cr of  1.56). ------------------------------------------------------------------------------------------------------------------ No results for input(s): HGBA1C in the last 72 hours. ------------------------------------------------------------------------------------------------------------------ No results for input(s): CHOL, HDL, LDLCALC, TRIG, CHOLHDL, LDLDIRECT in the last 72 hours. ------------------------------------------------------------------------------------------------------------------ No results for input(s): TSH, T4TOTAL, T3FREE, THYROIDAB in the last 72 hours.  Invalid input(s): FREET3 ------------------------------------------------------------------------------------------------------------------ No results for input(s): VITAMINB12, FOLATE, FERRITIN, TIBC, IRON, RETICCTPCT in the last 72 hours.  Coagulation profile No results for input(s): INR, PROTIME in the last 168 hours.  No results for input(s): DDIMER in the last 72 hours.  Cardiac Enzymes No results for input(s): CKMB, TROPONINI, MYOGLOBIN in the last 168 hours.  Invalid input(s): CK ------------------------------------------------------------------------------------------------------------------ Invalid input(s): POCBNP   CBG: No results for input(s): GLUCAP in the last 168 hours.     EKG: Independently reviewed. EKG shows ventricular paced rhythm   Assessment/Plan Acute Hypoxemic respiratory failure Secondary to acute CHF exacerbation versus healthcare associated associated pneumonia vs sepsis Obtain ABG, blood cultures, pro-calcitonin, lactic acid elevated Start patient on broad-spectrum antibiotics  Currently patient on a nonrebreather and may transition to BiPAP depending on ABG results  admit patient to step down  HCAP-patient started on vancomycin and Zosyn, blood culture 2, CT scan of the chest pending   Acute diastolic CHF (congestive heart failure) (HCC)-BNP elevated, however patient appears to  be dehydrated clinically and acute kidney injury Will hold off on diuresis, CT chest ordered to differentiate pneumonia from CHF If CT positive for CHF   go ahead and give 1 dose of Lasix tonight Previous 2-D echo showed an EF of 55-60% for distal inferior hypokinesis, last 2-D echo was 12/28/13  Recurrent falls-likely in the setting of dehydration and orthostasis, CT of the head ordered to rule out head trauma in the setting of 2 recent falls    Complete heart block (HCC)-patient has a pacemaker, abnormal troponin, likely in the setting of sepsis/ AKI , if  the troponin continues to trend up will consult cardiology tonight,    Hypertension-hold antihypertensive medications at this time as BP soft    Atrial fibrillation (HCC)-rate controlled, continue amiodarone, not on anticoagulation    Sepsis (HCC)-sepsis protocol initiated, follow serial pro-calcitonin, lactic acid, patient started on broad-spectrum antibiotics    AKI (acute kidney injury) (HCC)-Baseline creatinine around 1.0, will start the patient on gentle IV fluids     Code Status:   full Family Communication: bedside Disposition Plan: admit   Total time spent 55 minutes.Greater than 50% of this time was spent in counseling, explanation of diagnosis, planning of further management, and coordination of care  [facility] Pager [phone]  If 7PM-7AM, please contact night-coverage [email or web address] Password [facility] 07/21/2015, 6:02 PM

## 2015-08-04 ENCOUNTER — Inpatient Hospital Stay (HOSPITAL_COMMUNITY): Payer: Medicare Other

## 2015-08-04 DIAGNOSIS — R0600 Dyspnea, unspecified: Secondary | ICD-10-CM

## 2015-08-04 DIAGNOSIS — I480 Paroxysmal atrial fibrillation: Secondary | ICD-10-CM

## 2015-08-04 DIAGNOSIS — D72829 Elevated white blood cell count, unspecified: Secondary | ICD-10-CM

## 2015-08-04 DIAGNOSIS — I442 Atrioventricular block, complete: Secondary | ICD-10-CM

## 2015-08-04 DIAGNOSIS — I5032 Chronic diastolic (congestive) heart failure: Secondary | ICD-10-CM

## 2015-08-04 DIAGNOSIS — Z950 Presence of cardiac pacemaker: Secondary | ICD-10-CM

## 2015-08-04 DIAGNOSIS — A409 Streptococcal sepsis, unspecified: Secondary | ICD-10-CM

## 2015-08-04 LAB — POCT I-STAT 3, ART BLOOD GAS (G3+)
Acid-base deficit: 5 mmol/L — ABNORMAL HIGH (ref 0.0–2.0)
BICARBONATE: 20.5 meq/L (ref 20.0–24.0)
O2 Saturation: 94 %
PCO2 ART: 39.7 mmHg (ref 35.0–45.0)
TCO2: 22 mmol/L (ref 0–100)
pH, Arterial: 7.321 — ABNORMAL LOW (ref 7.350–7.450)
pO2, Arterial: 76 mmHg — ABNORMAL LOW (ref 80.0–100.0)

## 2015-08-04 LAB — GLUCOSE, CAPILLARY
GLUCOSE-CAPILLARY: 150 mg/dL — AB (ref 65–99)
GLUCOSE-CAPILLARY: 172 mg/dL — AB (ref 65–99)
GLUCOSE-CAPILLARY: 182 mg/dL — AB (ref 65–99)
GLUCOSE-CAPILLARY: 211 mg/dL — AB (ref 65–99)
Glucose-Capillary: 150 mg/dL — ABNORMAL HIGH (ref 65–99)

## 2015-08-04 LAB — COMPREHENSIVE METABOLIC PANEL
ALBUMIN: 2.8 g/dL — AB (ref 3.5–5.0)
ALT: 23 U/L (ref 14–54)
ANION GAP: 9 (ref 5–15)
AST: 35 U/L (ref 15–41)
Alkaline Phosphatase: 41 U/L (ref 38–126)
BILIRUBIN TOTAL: 0.8 mg/dL (ref 0.3–1.2)
BUN: 32 mg/dL — AB (ref 6–20)
CHLORIDE: 99 mmol/L — AB (ref 101–111)
CO2: 24 mmol/L (ref 22–32)
Calcium: 9 mg/dL (ref 8.9–10.3)
Creatinine, Ser: 1.29 mg/dL — ABNORMAL HIGH (ref 0.44–1.00)
GFR calc Af Amer: 42 mL/min — ABNORMAL LOW (ref >60)
GFR calc non Af Amer: 36 mL/min — ABNORMAL LOW (ref >60)
GLUCOSE: 151 mg/dL — AB (ref 65–99)
POTASSIUM: 4.3 mmol/L (ref 3.5–5.1)
SODIUM: 132 mmol/L — AB (ref 135–145)
TOTAL PROTEIN: 6 g/dL — AB (ref 6.5–8.1)

## 2015-08-04 LAB — INFLUENZA PANEL BY PCR (TYPE A & B)
H1N1 flu by pcr: NOT DETECTED
Influenza A By PCR: NEGATIVE
Influenza B By PCR: NEGATIVE

## 2015-08-04 LAB — CBC
HEMATOCRIT: 31.8 % — AB (ref 36.0–46.0)
Hemoglobin: 10.8 g/dL — ABNORMAL LOW (ref 12.0–15.0)
MCH: 31.3 pg (ref 26.0–34.0)
MCHC: 34 g/dL (ref 30.0–36.0)
MCV: 92.2 fL (ref 78.0–100.0)
PLATELETS: 176 10*3/uL (ref 150–400)
RBC: 3.45 MIL/uL — ABNORMAL LOW (ref 3.87–5.11)
RDW: 14.4 % (ref 11.5–15.5)
WBC: 24.1 10*3/uL — AB (ref 4.0–10.5)

## 2015-08-04 LAB — TROPONIN I: Troponin I: 0.31 ng/mL — ABNORMAL HIGH (ref <0.031)

## 2015-08-04 MED ORDER — HEPARIN SODIUM (PORCINE) 5000 UNIT/ML IJ SOLN
5000.0000 [IU] | Freq: Three times a day (TID) | INTRAMUSCULAR | Status: DC
  Administered 2015-08-04 – 2015-08-05 (×4): 5000 [IU] via SUBCUTANEOUS
  Filled 2015-08-04 (×4): qty 1

## 2015-08-04 MED ORDER — SODIUM CHLORIDE 0.9 % IV SOLN
INTRAVENOUS | Status: AC
  Administered 2015-08-04: 05:00:00 via INTRAVENOUS

## 2015-08-04 MED ORDER — CHLORHEXIDINE GLUCONATE 0.12 % MT SOLN
15.0000 mL | Freq: Two times a day (BID) | OROMUCOSAL | Status: DC
  Administered 2015-08-04 – 2015-08-05 (×2): 15 mL via OROMUCOSAL
  Filled 2015-08-04: qty 15

## 2015-08-04 MED ORDER — LORAZEPAM 2 MG/ML IJ SOLN
0.5000 mg | Freq: Once | INTRAMUSCULAR | Status: AC
  Administered 2015-08-04: 0.5 mg via INTRAVENOUS
  Filled 2015-08-04: qty 1

## 2015-08-04 MED ORDER — LORAZEPAM 2 MG/ML IJ SOLN
0.5000 mg | Freq: Three times a day (TID) | INTRAMUSCULAR | Status: DC | PRN
  Administered 2015-08-05: 0.5 mg via INTRAVENOUS
  Filled 2015-08-04: qty 1

## 2015-08-04 MED ORDER — LORAZEPAM 2 MG/ML IJ SOLN: 0.5000 mg | Freq: Once | INTRAMUSCULAR | Status: AC

## 2015-08-04 MED ORDER — MUPIROCIN 2 % EX OINT
1.0000 "application " | TOPICAL_OINTMENT | Freq: Two times a day (BID) | CUTANEOUS | Status: DC
  Administered 2015-08-04 – 2015-08-05 (×4): 1 via NASAL
  Filled 2015-08-04 (×2): qty 22

## 2015-08-04 MED ORDER — PANTOPRAZOLE SODIUM 40 MG IV SOLR
40.0000 mg | Freq: Two times a day (BID) | INTRAVENOUS | Status: DC
  Administered 2015-08-04 – 2015-08-05 (×3): 40 mg via INTRAVENOUS
  Filled 2015-08-04 (×3): qty 40

## 2015-08-04 MED ORDER — CHLORHEXIDINE GLUCONATE CLOTH 2 % EX PADS
6.0000 | MEDICATED_PAD | Freq: Every day | CUTANEOUS | Status: DC
  Administered 2015-08-04 – 2015-08-05 (×2): 6 via TOPICAL

## 2015-08-04 MED ORDER — CETYLPYRIDINIUM CHLORIDE 0.05 % MT LIQD
7.0000 mL | Freq: Two times a day (BID) | OROMUCOSAL | Status: DC
  Administered 2015-08-05 (×2): 7 mL via OROMUCOSAL

## 2015-08-04 MED ORDER — IPRATROPIUM-ALBUTEROL 0.5-2.5 (3) MG/3ML IN SOLN: 3.0000 mL | RESPIRATORY_TRACT | Status: DC | PRN

## 2015-08-04 MED ORDER — SODIUM CHLORIDE 0.9 % IV SOLN
INTRAVENOUS | Status: DC
  Administered 2015-08-04: 20:00:00 via INTRAVENOUS

## 2015-08-04 MED ORDER — LORAZEPAM 2 MG/ML IJ SOLN
INTRAMUSCULAR | Status: AC
  Administered 2015-08-04: 0.5 mg
  Filled 2015-08-04: qty 1

## 2015-08-04 MED ORDER — VANCOMYCIN HCL 500 MG IV SOLR
500.0000 mg | INTRAVENOUS | Status: DC
  Administered 2015-08-04 – 2015-08-05 (×2): 500 mg via INTRAVENOUS
  Filled 2015-08-04 (×2): qty 500

## 2015-08-04 NOTE — Progress Notes (Signed)
Dr.Harduk made aware patient is not oriented, agitated, restless in bed, B/P and HR stable, yet had to give IV Dilaudid in order for patient to settle down to keep bipap on. PO Tylenol and Protonix was ordered but patient is not coherent enough and feel it is safe to attempt PO Meds. Orders received

## 2015-08-04 NOTE — Progress Notes (Signed)
Cbg- at 12 noon was 174. Result text paged to Dr. Susie CassetteAbrol with no order.

## 2015-08-04 NOTE — Progress Notes (Signed)
[facility] PROGRESS NOTE  Julie Serrano ZOX:[id number] DOB: 1925-05-13 DOA: 08/08/2015 PCP: Garvin Fila, MD  Length of stay: 1   Assessment/Plan: Principal Problem:   CHF (congestive heart failure) (HCC) Active Problems:   Complete heart block (HCC)   Hypertension   Atrial fibrillation (HCC)   Sepsis (HCC)   AKI (acute kidney injury) [facility])    Brief summary 81 year old female with a history of paroxysmal atrial fibrillation, hypertension, dyslipidemia, cholelithiasis with dilated common bile duct, complete heart block status post pacemaker followed by Dr. Ladona Ridgel of cardiology, currently at a long-term care facility, who presents to the ER for worsening shortness of breath since Sunday. Patient is on a nonrebreather and her daughter provides most of the history. Patient started developing generalized weakness and lightheadedness, and had a fall on Sunday, fell backwards and hit the back of her head. She had blood work done at the nursing home that revealed that the patient possibly have a UTI, started on Levaquin 250 mg a day on Sunday. However patient shortness of breath progressively got worse, associated with a productive cough. Brought in by EMS and was found to have a temperature of 104, oxygen saturation of 82% on room air, initial blood pressure 1 19 /74. Heart rate 106, 84% on 5 L. Patient subsequently placed on a nonrebreather. Fund of a sodium of 125, acute kidney injury with a creatinine of 1.56, glucose 203, [id number]. Troponin 0.55. Lactic acid 3.58. White blood cell count of 20.7. Patient is being admitted for sepsis to step down. Chest x-ray shows indistinct opacity, pneumonia versus CHF   Assessment and plan Acute Hypoxemic respiratory failure Secondary to acute CHF exacerbation versus healthcare associated associated pneumonia vs sepsis ABG consistent with hypoxemic respiratory failure, blood cultures positive for gram-positive cocci in clusters ,  pro-calcitonin 0.25, lactic acid elevated Continue broad-spectrum antibiotics Requiring BiPAP support, did not tolerate nasal cannula or Ventimask Continue step down Prognosis poor , discussed with the patient's daughter If no improvement , will consider palliative care consultation   HCAP-patient started on vancomycin and Zosyn, blood culture positive for gram-positive clusters, CT scan of the chest chest, abdomen, pelvis showed small bilateral pleural effusions with diffuse patchy bilateral airspace opacification consistent with pneumonia. White count has increased despite broad-spectrum antibiotics   Chronic diastolic CHF (congestive heart failure) without exacerbation (HCC)-BNP elevated, however patient appears to be dehydrated clinically and acute kidney injury Will hold off on diuresis, 2-D echo showed EF of 55-60% with normal wall motion Continue gentle IV fluids   Recurrent falls-likely in the setting of dehydration and orthostasis, CT of the head ordered to rule out head trauma in the setting of 2 recent falls   Complete heart block (HCC)-patient has a pacemaker, abnormal troponin, likely in the setting of sepsis/ AKI , patient would need cardiology evaluation of a pacemaker in the setting of positive blood cultures.    Hypertension-hold antihypertensive medications at this time as BP soft, continue IV fluids   Atrial fibrillation (HCC)-rate controlled, continue amiodarone, not on anticoagulation   Sepsis (HCC)-sepsis protocol initiated, follow serial pro-calcitonin, lactic acid, patient started on broad-spectrum antibiotics   AKI (acute kidney injury) (HCC)-Baseline creatinine around 1.0, slight improvement from 1.56> 1.29    DVT prophylaxsis heparin  Code Status:      Code Status Orders        Start     Ordered   08/04/15 1112  Do not attempt resuscitation (DNR)   Continuous  Question Answer Comment  In the event of cardiac or respiratory ARREST Do not  call a "code blue"   In the event of cardiac or respiratory ARREST Do not perform Intubation, CPR, defibrillation or ACLS   In the event of cardiac or respiratory ARREST Use medication by any route, position, wound care, and other measures to relive pain and suffering. May use oxygen, suction and manual treatment of airway obstruction as needed for comfort.      08/04/15 1111    Advance Directive Documentation        Most Recent Value   Type of Advance Directive  Living will   Pre-existing out of facility DNR order (yellow form or pink MOST form)     "MOST" Form in Place?       Family Communication: family updated about patient's clinical progress Disposition Plan:  As above      Consultants:  None  Procedures:  None  Antibiotics: Anti-infectives    Start     Dose/Rate Route Frequency Ordered Stop   08/04/15 1700  vancomycin (VANCOCIN) 500 mg in sodium chloride 0.9 % 100 mL IVPB  Status:  Discontinued     500 mg 100 mL/hr over 60 Minutes Intravenous Every 12 hours 08-09-15 1946 08/04/15 0938   08/04/15 1700  vancomycin (VANCOCIN) 500 mg in sodium chloride 0.9 % 100 mL IVPB     500 mg 100 mL/hr over 60 Minutes Intravenous Every 24 hours 08/04/15 0938     08/04/15 1000  oseltamivir (TAMIFLU) capsule 30 mg  Status:  Discontinued     30 mg Oral Daily 08/09/15 1946 08/04/15 0840   Aug 09, 2015 2230  piperacillin-tazobactam (ZOSYN) IVPB 3.375 g     3.375 g 12.5 mL/hr over 240 Minutes Intravenous Every 8 hours 08/09/15 1946     08/09/2015 2200  oseltamivir (TAMIFLU) capsule 30 mg  Status:  Discontinued     30 mg Oral 2 times daily 2015/08/09 1550 08-09-15 1555   August 09, 2015 1745  piperacillin-tazobactam (ZOSYN) IVPB 3.375 g  Status:  Discontinued     3.375 g 100 mL/hr over 30 Minutes Intravenous  Once 08/09/2015 1736 Aug 09, 2015 1755   2015-08-09 1745  vancomycin (VANCOCIN) IVPB 1000 mg/200 mL premix  Status:  Discontinued     1,000 mg 200 mL/hr over 60 Minutes Intravenous  Once 2015-08-09 1736  08-09-15 1754   Aug 09, 2015 1700  oseltamivir (TAMIFLU) capsule 30 mg     30 mg Oral  Once 08/09/2015 1555 08-09-2015 1608   09-Aug-2015 1630  piperacillin-tazobactam (ZOSYN) IVPB 3.375 g     3.375 g 100 mL/hr over 30 Minutes Intravenous  Once August 09, 2015 1619 08/09/2015 1706   2015-08-09 1630  vancomycin (VANCOCIN) IVPB 1000 mg/200 mL premix     1,000 mg 200 mL/hr over 60 Minutes Intravenous  Once 08/09/15 1619 Aug 09, 2015 1806   08-09-2015 1600  vancomycin (VANCOCIN) IVPB 1000 mg/200 mL premix  Status:  Discontinued     1,000 mg 200 mL/hr over 60 Minutes Intravenous  Once 08-09-2015 1553 Aug 09, 2015 1556   08-09-2015 1600  piperacillin-tazobactam (ZOSYN) IVPB 3.375 g  Status:  Discontinued     3.375 g 100 mL/hr over 30 Minutes Intravenous  Once Aug 09, 2015 1553 2015-08-09 1556         HPI/Subjective: Patient extremely confused, discussed with the patient's daughter, patient not improving, hypoxic, confused, did not tolerate coming off BiPAP and had to be placed back on BiPAP, overall prognosis appears to be poor  Objective: Filed Vitals:   08/04/15  0700 08/04/15 0900 08/04/15 1104 08/04/15 1130  BP: 118/69 96/58 103/90   Pulse: 99 100 103 101  Temp: 98.8 F (37.1 C)  97.8 F (36.6 C)   TempSrc: Axillary  Axillary   Resp: 27 25 22 25   Height:      Weight:      SpO2: 100% 97% 92% 91%    Intake/Output Summary (Last 24 hours) at 08/04/15 1148 Last data filed at 08/04/15 1000  Gross per 24 hour  Intake   1533 ml  Output    100 ml  Net   1433 ml    Exam:  General: Confused, on BiPAP Lungs: By basilar wheezing or crackles Cardiovascular: Regular rate and rhythm without murmur gallop or rub normal S1 and S2 Abdomen: Nontender, nondistended, soft, bowel sounds positive, no rebound, no ascites, no appreciable mass Extremities: No significant cyanosis, clubbing, or edema bilateral lower extremities     Data Review   Micro Results Recent Results (from the past 240 hour(s))  Culture, blood (routine  x 2)     Status: None (Preliminary result)   Collection Time: 07/17/2015  3:47 PM  Result Value Ref Range Status   Specimen Description BLOOD RIGHT FOREARM  Final   Special Requests BOTTLES DRAWN AEROBIC AND ANAEROBIC 5CC  Final   Culture  Setup Time   Final    GRAM POSITIVE COCCI IN CLUSTERS IN BOTH AEROBIC AND ANAEROBIC BOTTLES CRITICAL RESULT CALLED TO, READ BACK BY AND VERIFIED WITH: T [email or web address]  08/04/15 MKELLY    Culture GRAM POSITIVE COCCI  Final   Report Status PENDING  Incomplete  Culture, blood (routine x 2)     Status: None (Preliminary result)   Collection Time: 07/31/2015  3:59 PM  Result Value Ref Range Status   Specimen Description BLOOD LEFT ANTECUBITAL  Final   Special Requests BOTTLES DRAWN AEROBIC AND ANAEROBIC 5CC  Final   Culture  Setup Time   Final    GRAM POSITIVE COCCI IN CLUSTERS IN BOTH AEROBIC AND ANAEROBIC BOTTLES CRITICAL RESULT CALLED TO, READ BACK BY AND VERIFIED WITH: T OLEARY 08/04/15 @ 0814 M VESTAL    Culture GRAM POSITIVE COCCI  Final   Report Status PENDING  Incomplete  Urine culture     Status: None (Preliminary result)   Collection Time: 08/05/2015  5:11 PM  Result Value Ref Range Status   Specimen Description URINE, CATHETERIZED  Final   Special Requests Normal  Final   Culture NO GROWTH < 24 HOURS  Final   Report Status PENDING  Incomplete  MRSA PCR Screening     Status: Abnormal   Collection Time: 07/25/2015  6:43 PM  Result Value Ref Range Status   MRSA by PCR POSITIVE (A) NEGATIVE Final    Comment:        The GeneXpert MRSA Assay (FDA approved for NASAL specimens only), is one component of a comprehensive MRSA colonization surveillance program. It is not intended to diagnose MRSA infection nor to guide or monitor treatment for MRSA infections. RESULT CALLED TO, READ BACK BY AND VERIFIED WITH: Claire Shown RN 2025 08/07/2015 A BROWNING     Radiology Reports Ct Abdomen Pelvis Wo Contrast  08/04/2015  CLINICAL DATA:  Status post fall at  living center. Concern for chest or abdominal injury. Initial encounter. EXAM: CT CHEST, ABDOMEN AND PELVIS WITHOUT CONTRAST TECHNIQUE: Multidetector CT imaging of the chest, abdomen and pelvis was performed following the standard protocol without IV contrast. COMPARISON:  Chest radiograph performed 07/21/2015 FINDINGS:  CT CHEST FINDINGS Small bilateral pleural effusions are noted, with diffuse patchy bilateral airspace opacification. This may reflect pulmonary edema or atypical infection. No pneumothorax is seen. Scattered coronary artery calcifications are seen. Scattered calcification is noted along the thoracic aorta. The mediastinum is otherwise unremarkable. No mediastinal lymphadenopathy is appreciated. No pericardial effusion is identified. The great vessels are grossly unremarkable. A left-sided chest port is noted with its leads ending at the right atrium and right ventricle. The thyroid gland is unremarkable in appearance. No axillary lymphadenopathy is seen. No acute osseous abnormalities are identified. CT ABDOMEN AND PELVIS FINDINGS The liver and spleen are unremarkable in appearance. The patient is status post cholecystectomy, with clips noted along the gallbladder fossa. The pancreas and adrenal glands are unremarkable. The kidneys are unremarkable in appearance. There is no evidence of hydronephrosis. No renal or ureteral stones are seen. No perinephric stranding is appreciated. No free fluid is identified. The small bowel is unremarkable in appearance. The stomach is within normal limits. No acute vascular abnormalities are seen. Scattered calcification is noted along the abdominal aorta and its branches. The appendix is normal in caliber and contains air, without evidence of appendicitis. Mild diverticulosis is noted along the sigmoid colon, without evidence of diverticulitis. The bladder is decompressed, with a Foley catheter in place. The patient is status post hysterectomy. No suspicious  adnexal masses are seen. No inguinal lymphadenopathy is seen. No acute osseous abnormalities are identified. There are chronic compression deformities involving vertebral bodies T12 and L4, without definite acute fracture. IMPRESSION: 1. No evidence of traumatic injury to the chest, abdomen or pelvis. 2. Small bilateral pleural effusions, with diffuse patchy bilateral airspace opacification. This may reflect pulmonary edema or atypical infection. 3. Scattered coronary artery calcifications seen. 4. Scattered calcification along the abdominal aorta and its branches. 5. Mild diverticulosis along the sigmoid colon, without evidence of diverticulitis. 6. Chronic compression deformities involving vertebral bodies T12 and L4, without definite acute fracture. Electronically Signed   By: Roanna Raider M.D.   On: 08/04/2015 06:39   Ct Head Wo Contrast  08/04/2015  CLINICAL DATA:  Status post fall, with hypoxia. Concern for head injury. Initial encounter. EXAM: CT HEAD WITHOUT CONTRAST TECHNIQUE: Contiguous axial images were obtained from the base of the skull through the vertex without intravenous contrast. COMPARISON:  CT of the head performed 12/26/2013 FINDINGS: There is no evidence of acute infarction, mass lesion, or intra- or extra-axial hemorrhage on CT. Prominence of the ventricles and sulci reflects mild to moderate cortical volume loss. Mild cerebellar atrophy is noted. Scattered periventricular and subcortical white matter change likely reflects small vessel ischemic microangiopathy. The brainstem and fourth ventricle are within normal limits. The basal ganglia are unremarkable in appearance. The cerebral hemispheres demonstrate grossly normal gray-white differentiation. No mass effect or midline shift is seen. There is no evidence of fracture; visualized osseous structures are unremarkable in appearance. The orbits are within normal limits. The paranasal sinuses and mastoid air cells are well-aerated. No  significant soft tissue abnormalities are seen. IMPRESSION: 1. No evidence of traumatic intracranial injury or fracture. 2. Mild to moderate cortical volume loss and scattered small vessel ischemic microangiopathy. Electronically Signed   By: Roanna Raider M.D.   On: 08/04/2015 06:33   Ct Chest Wo Contrast  08/04/2015  CLINICAL DATA:  Status post fall at living center. Concern for chest or abdominal injury. Initial encounter. EXAM: CT CHEST, ABDOMEN AND PELVIS WITHOUT CONTRAST TECHNIQUE: Multidetector CT imaging of the chest, abdomen and  pelvis was performed following the standard protocol without IV contrast. COMPARISON:  Chest radiograph performed 08/08/2015 FINDINGS: CT CHEST FINDINGS Small bilateral pleural effusions are noted, with diffuse patchy bilateral airspace opacification. This may reflect pulmonary edema or atypical infection. No pneumothorax is seen. Scattered coronary artery calcifications are seen. Scattered calcification is noted along the thoracic aorta. The mediastinum is otherwise unremarkable. No mediastinal lymphadenopathy is appreciated. No pericardial effusion is identified. The great vessels are grossly unremarkable. A left-sided chest port is noted with its leads ending at the right atrium and right ventricle. The thyroid gland is unremarkable in appearance. No axillary lymphadenopathy is seen. No acute osseous abnormalities are identified. CT ABDOMEN AND PELVIS FINDINGS The liver and spleen are unremarkable in appearance. The patient is status post cholecystectomy, with clips noted along the gallbladder fossa. The pancreas and adrenal glands are unremarkable. The kidneys are unremarkable in appearance. There is no evidence of hydronephrosis. No renal or ureteral stones are seen. No perinephric stranding is appreciated. No free fluid is identified. The small bowel is unremarkable in appearance. The stomach is within normal limits. No acute vascular abnormalities are seen. Scattered  calcification is noted along the abdominal aorta and its branches. The appendix is normal in caliber and contains air, without evidence of appendicitis. Mild diverticulosis is noted along the sigmoid colon, without evidence of diverticulitis. The bladder is decompressed, with a Foley catheter in place. The patient is status post hysterectomy. No suspicious adnexal masses are seen. No inguinal lymphadenopathy is seen. No acute osseous abnormalities are identified. There are chronic compression deformities involving vertebral bodies T12 and L4, without definite acute fracture. IMPRESSION: 1. No evidence of traumatic injury to the chest, abdomen or pelvis. 2. Small bilateral pleural effusions, with diffuse patchy bilateral airspace opacification. This may reflect pulmonary edema or atypical infection. 3. Scattered coronary artery calcifications seen. 4. Scattered calcification along the abdominal aorta and its branches. 5. Mild diverticulosis along the sigmoid colon, without evidence of diverticulitis. 6. Chronic compression deformities involving vertebral bodies T12 and L4, without definite acute fracture. Electronically Signed   By: Roanna Raider M.D.   On: 08/04/2015 06:39   Dg Chest Port 1 View  2015-08-08  CLINICAL DATA:  Shortness of breath.  Sore throat.  Hypoxia. EXAM: PORTABLE CHEST 1 VIEW COMPARISON:  12/29/2013 FINDINGS: New prominently reverse lordotic projection likely due to kyphosis. Dual lead pacer in place, although distal lead tip is below the inferior margin of today's image. Moderate enlargement of the cardiopericardial silhouette. Atherosclerotic aortic arch. Patchy bilateral airspace opacities especially in the perihilar and basilar regions. Indistinct obscuration of the hemidiaphragms, right greater the left. IMPRESSION: 1. Indistinct vasculature with bilateral airspace opacities and enlargement of the cardiopericardial silhouette, favoring acute pulmonary edema. 2. Atherosclerotic aortic  arch. 3. Dual lead pacer in place. 4. Cannot exclude layering pleural effusions. Electronically Signed   By: Gaylyn Rong M.D.   On: 08-08-2015 16:50     CBC  Recent Labs Lab 08-08-2015 1547 2015/08/08 1812 08/04/15 1005  WBC 20.7* 20.5* 24.1*  HGB 10.2* 10.6* 10.8*  HCT 29.6* 31.5* 31.8*  PLT 140* 144* 176  MCV 91.6 91.3 92.2  MCH 31.6 30.7 31.3  MCHC 34.5 33.7 34.0  RDW 14.3 14.2 14.4  LYMPHSABS 0.5*  --   --   MONOABS 2.6*  --   --   EOSABS 0.0  --   --   BASOSABS 0.0  --   --     Chemistries   Recent Labs  Lab 07/13/2015 1547 07/14/2015 1812 07/27/2015 1817 08/04/15 1005  NA 125*  --   --  132*  K 4.4  --   --  4.3  CL 96*  --   --  99*  CO2 19*  --   --  24  GLUCOSE 203*  --   --  151*  BUN 38*  --   --  32*  CREATININE 1.56* 1.48*  --  1.29*  CALCIUM 8.8*  --   --  9.0  MG  --   --  1.6*  --   AST  --   --   --  35  ALT  --   --   --  23  ALKPHOS  --   --   --  41  BILITOT  --   --   --  0.8   ------------------------------------------------------------------------------------------------------------------ estimated creatinine clearance is 25.9 mL/min (by C-G formula based on Cr of 1.29). ------------------------------------------------------------------------------------------------------------------ No results for input(s): HGBA1C in the last 72 hours. ------------------------------------------------------------------------------------------------------------------ No results for input(s): CHOL, HDL, LDLCALC, TRIG, CHOLHDL, LDLDIRECT in the last 72 hours. ------------------------------------------------------------------------------------------------------------------  Recent Labs  07/14/2015 1811  TSH 0.331*   ------------------------------------------------------------------------------------------------------------------ No results for input(s): VITAMINB12, FOLATE, FERRITIN, TIBC, IRON, RETICCTPCT in the last 72 hours.  Coagulation profile  Recent  Labs Lab 08/02/2015 1817  INR 2.24*    No results for input(s): DDIMER in the last 72 hours.  Cardiac Enzymes  Recent Labs Lab 07/30/2015 1817 07/15/2015 2319 08/04/15 0525  TROPONINI 0.54* 0.38* 0.31*   ------------------------------------------------------------------------------------------------------------------ Invalid input(s): POCBNP   CBG:  Recent Labs Lab 07/19/2015 2127 07/20/2015 2351 08/04/15 0420  GLUCAP 157* 182* 211*       Studies: Ct Abdomen Pelvis Wo Contrast  08/04/2015  CLINICAL DATA:  Status post fall at living center. Concern for chest or abdominal injury. Initial encounter. EXAM: CT CHEST, ABDOMEN AND PELVIS WITHOUT CONTRAST TECHNIQUE: Multidetector CT imaging of the chest, abdomen and pelvis was performed following the standard protocol without IV contrast. COMPARISON:  Chest radiograph performed 07/16/2015 FINDINGS: CT CHEST FINDINGS Small bilateral pleural effusions are noted, with diffuse patchy bilateral airspace opacification. This may reflect pulmonary edema or atypical infection. No pneumothorax is seen. Scattered coronary artery calcifications are seen. Scattered calcification is noted along the thoracic aorta. The mediastinum is otherwise unremarkable. No mediastinal lymphadenopathy is appreciated. No pericardial effusion is identified. The great vessels are grossly unremarkable. A left-sided chest port is noted with its leads ending at the right atrium and right ventricle. The thyroid gland is unremarkable in appearance. No axillary lymphadenopathy is seen. No acute osseous abnormalities are identified. CT ABDOMEN AND PELVIS FINDINGS The liver and spleen are unremarkable in appearance. The patient is status post cholecystectomy, with clips noted along the gallbladder fossa. The pancreas and adrenal glands are unremarkable. The kidneys are unremarkable in appearance. There is no evidence of hydronephrosis. No renal or ureteral stones are seen. No  perinephric stranding is appreciated. No free fluid is identified. The small bowel is unremarkable in appearance. The stomach is within normal limits. No acute vascular abnormalities are seen. Scattered calcification is noted along the abdominal aorta and its branches. The appendix is normal in caliber and contains air, without evidence of appendicitis. Mild diverticulosis is noted along the sigmoid colon, without evidence of diverticulitis. The bladder is decompressed, with a Foley catheter in place. The patient is status post hysterectomy. No suspicious adnexal masses are seen. No inguinal lymphadenopathy is seen. No acute osseous abnormalities are  identified. There are chronic compression deformities involving vertebral bodies T12 and L4, without definite acute fracture. IMPRESSION: 1. No evidence of traumatic injury to the chest, abdomen or pelvis. 2. Small bilateral pleural effusions, with diffuse patchy bilateral airspace opacification. This may reflect pulmonary edema or atypical infection. 3. Scattered coronary artery calcifications seen. 4. Scattered calcification along the abdominal aorta and its branches. 5. Mild diverticulosis along the sigmoid colon, without evidence of diverticulitis. 6. Chronic compression deformities involving vertebral bodies T12 and L4, without definite acute fracture. Electronically Signed   By: Roanna Raider M.D.   On: 08/04/2015 06:39   Ct Head Wo Contrast  08/04/2015  CLINICAL DATA:  Status post fall, with hypoxia. Concern for head injury. Initial encounter. EXAM: CT HEAD WITHOUT CONTRAST TECHNIQUE: Contiguous axial images were obtained from the base of the skull through the vertex without intravenous contrast. COMPARISON:  CT of the head performed 12/26/2013 FINDINGS: There is no evidence of acute infarction, mass lesion, or intra- or extra-axial hemorrhage on CT. Prominence of the ventricles and sulci reflects mild to moderate cortical volume loss. Mild cerebellar  atrophy is noted. Scattered periventricular and subcortical white matter change likely reflects small vessel ischemic microangiopathy. The brainstem and fourth ventricle are within normal limits. The basal ganglia are unremarkable in appearance. The cerebral hemispheres demonstrate grossly normal gray-white differentiation. No mass effect or midline shift is seen. There is no evidence of fracture; visualized osseous structures are unremarkable in appearance. The orbits are within normal limits. The paranasal sinuses and mastoid air cells are well-aerated. No significant soft tissue abnormalities are seen. IMPRESSION: 1. No evidence of traumatic intracranial injury or fracture. 2. Mild to moderate cortical volume loss and scattered small vessel ischemic microangiopathy. Electronically Signed   By: Roanna Raider M.D.   On: 08/04/2015 06:33   Ct Chest Wo Contrast  08/04/2015  CLINICAL DATA:  Status post fall at living center. Concern for chest or abdominal injury. Initial encounter. EXAM: CT CHEST, ABDOMEN AND PELVIS WITHOUT CONTRAST TECHNIQUE: Multidetector CT imaging of the chest, abdomen and pelvis was performed following the standard protocol without IV contrast. COMPARISON:  Chest radiograph performed 07/27/2015 FINDINGS: CT CHEST FINDINGS Small bilateral pleural effusions are noted, with diffuse patchy bilateral airspace opacification. This may reflect pulmonary edema or atypical infection. No pneumothorax is seen. Scattered coronary artery calcifications are seen. Scattered calcification is noted along the thoracic aorta. The mediastinum is otherwise unremarkable. No mediastinal lymphadenopathy is appreciated. No pericardial effusion is identified. The great vessels are grossly unremarkable. A left-sided chest port is noted with its leads ending at the right atrium and right ventricle. The thyroid gland is unremarkable in appearance. No axillary lymphadenopathy is seen. No acute osseous abnormalities are  identified. CT ABDOMEN AND PELVIS FINDINGS The liver and spleen are unremarkable in appearance. The patient is status post cholecystectomy, with clips noted along the gallbladder fossa. The pancreas and adrenal glands are unremarkable. The kidneys are unremarkable in appearance. There is no evidence of hydronephrosis. No renal or ureteral stones are seen. No perinephric stranding is appreciated. No free fluid is identified. The small bowel is unremarkable in appearance. The stomach is within normal limits. No acute vascular abnormalities are seen. Scattered calcification is noted along the abdominal aorta and its branches. The appendix is normal in caliber and contains air, without evidence of appendicitis. Mild diverticulosis is noted along the sigmoid colon, without evidence of diverticulitis. The bladder is decompressed, with a Foley catheter in place. The patient is status post  hysterectomy. No suspicious adnexal masses are seen. No inguinal lymphadenopathy is seen. No acute osseous abnormalities are identified. There are chronic compression deformities involving vertebral bodies T12 and L4, without definite acute fracture. IMPRESSION: 1. No evidence of traumatic injury to the chest, abdomen or pelvis. 2. Small bilateral pleural effusions, with diffuse patchy bilateral airspace opacification. This may reflect pulmonary edema or atypical infection. 3. Scattered coronary artery calcifications seen. 4. Scattered calcification along the abdominal aorta and its branches. 5. Mild diverticulosis along the sigmoid colon, without evidence of diverticulitis. 6. Chronic compression deformities involving vertebral bodies T12 and L4, without definite acute fracture. Electronically Signed   By: Roanna Raider M.D.   On: 08/04/2015 06:39   Dg Chest Port 1 View  August 20, 2015  CLINICAL DATA:  Shortness of breath.  Sore throat.  Hypoxia. EXAM: PORTABLE CHEST 1 VIEW COMPARISON:  12/29/2013 FINDINGS: New prominently reverse  lordotic projection likely due to kyphosis. Dual lead pacer in place, although distal lead tip is below the inferior margin of today's image. Moderate enlargement of the cardiopericardial silhouette. Atherosclerotic aortic arch. Patchy bilateral airspace opacities especially in the perihilar and basilar regions. Indistinct obscuration of the hemidiaphragms, right greater the left. IMPRESSION: 1. Indistinct vasculature with bilateral airspace opacities and enlargement of the cardiopericardial silhouette, favoring acute pulmonary edema. 2. Atherosclerotic aortic arch. 3. Dual lead pacer in place. 4. Cannot exclude layering pleural effusions. Electronically Signed   By: Gaylyn Rong M.D.   On: 08-20-15 16:50      No results found for: HGBA1C Lab Results  Component Value Date   CREATININE 1.29* 08/04/2015       Scheduled Meds: . acetaminophen  650 mg Oral 3 times per day  . amiodarone  200 mg Oral Daily  . Chlorhexidine Gluconate Cloth  6 each Topical Q0600  . escitalopram  10 mg Oral Daily  . fluticasone  1 spray Each Nare Daily  . ipratropium-albuterol  3 mL Nebulization Q6H  . latanoprost  1 drop Both Eyes QHS  . loratadine  10 mg Oral Daily  . mupirocin ointment  1 application Nasal BID  . pantoprazole  40 mg Oral BID  . piperacillin-tazobactam (ZOSYN)  IV  3.375 g Intravenous Q8H  . sodium chloride  1 spray Each Nare QID  . sodium chloride  3 mL Intravenous Q12H  . sodium chloride  3 mL Intravenous Q12H  . vancomycin  500 mg Intravenous Q24H   Continuous Infusions: . sodium chloride 100 mL/hr at 08/04/15 0820    Principal Problem:   CHF (congestive heart failure) (HCC) Active Problems:   Complete heart block (HCC)   Hypertension   Atrial fibrillation (HCC)   Sepsis (HCC)   AKI (acute kidney injury) (HCC)    Time spent: 45 minutes   [facility]  [facility] Pager [phone]. If 7PM-7AM, please contact night-coverage at [email or web address], password  [facility] 08/04/2015, 11:48 AM  LOS: 1 day

## 2015-08-04 NOTE — Progress Notes (Signed)
DEsats to 80's, placed  back on bipap, o2 sat went up to 98%, cont. to monitor.

## 2015-08-04 NOTE — Progress Notes (Signed)
Placed patient on BiPAP due to increased WOB and very wet lungs. Patient is tolerating it well and RT will continue to monitor.

## 2015-08-04 NOTE — Progress Notes (Signed)
Transported patient back from CT on BiPAP with no complications. RT will continue to monitor.

## 2015-08-04 NOTE — Progress Notes (Signed)
Paged Craige CottaKirby NP of positive blood cultures- Gram positive cocci in clusters.

## 2015-08-04 NOTE — Progress Notes (Signed)
  Echocardiogram 2D Echocardiogram has been performed.  Julie SavoyCasey N Sheneka Serrano 08/04/2015, 9:58 AM

## 2015-08-04 NOTE — Progress Notes (Signed)
Bipap machine kept on alarming, Resp. Therapist made aware.

## 2015-08-04 NOTE — Progress Notes (Signed)
Aerobic Bottle drawn on 23rd is positive for Gram + cocci in clusters. Patients nurse Chat Decena notified.

## 2015-08-04 NOTE — Consult Note (Signed)
CARDIOLOGY CONSULT NOTE   Patient ID: MEKAELA AZIZI MRN: [id number], DOB/AGE: 1925/07/25   Admit date: 08/05/2015 Date of Consult: 08/04/2015   Primary Physician: Garvin Fila, MD Primary Cardiologist: Dr. Ladona Ridgel  Pt. Profile  81 year old Caucasian female with past medical history of PAF on amiodarone and Coumadin, chronic diastolic heart failure, HLD, PAF, HTN, history of CHB s/p Medtronic dual-chamber pacemaker in 12/2013 presented with severe sepsis. Cardiology consulted for management of PPM   Problem List  Past Medical History  Diagnosis Date  . Anxiety and depression   . Arthritis   . Atrial fibrillation (HCC)     a. amiodarone  . Hypertension   . Hyperlipemia   . Diverticulitis   . Esophageal stricture   . Complete heart block [facility])     s/p dual chamber PPM implantation April 2015    Past Surgical History  Procedure Laterality Date  . Appendectomy    . Cholecystectomy    . Abdominal hysterectomy    . Pacemaker insertion  12-28-2013    MDT dual chamber pacemaker implanted by Dr Ladona Ridgel for CHB  . Left heart catheterization with coronary angiogram N/A 12/27/2013    Procedure: LEFT HEART CATHETERIZATION WITH CORONARY ANGIOGRAM;  Surgeon: Corky Crafts, MD;  Location: [facility] CATH LAB;  Service: Cardiovascular;  Laterality: N/A;  . Permanent pacemaker insertion N/A 12/28/2013    Procedure: PERMANENT PACEMAKER INSERTION;  Surgeon: Marinus Maw, MD;  Location: [facility] CATH LAB;  Service: Cardiovascular;  Laterality: N/A;     Allergies  Allergies  Allergen Reactions  . Ativan [Lorazepam] Other (See Comments)    Extreme delirium  . Sulfa Antibiotics     unknown    HPI   The patient is a 81 year old Caucasian female with past medical history of PAF on amiodarone and Coumadin, chronic diastolic heart failure, hyperlipidemia, PAF, hypertension, history of CHB s/p Medtronic dual-chamber pacemaker in 12/2013. The last time patient was seen by Dr. Ladona Ridgel was in the  office on 01/11/2015 at which time she was doing well maintaining normal sinus rhythm 99.9% of the time. Her blood pressure was fairly well-controlled. She had no recurrent syncopal episodes and her pacemaker is working properly. She resides in [place].  Patient presented to [facility] on 07/19/2015 with generalized weakness and lightheadedness started on Sunday. She also fell backward and hit her head. Initial blood work shows positive for pneumonia (although CT of chest looks ARDS) and urinary tract infection. Overnight, her respiratory function declined, white blood cell count trended from 20 up to 24. She is currently satting 80% on nonrebreather. Heart rate in the 100 to 120s range. Troponin was mildly elevated at 0.55. Lactic acid elevated at 3.58. Patient is being aggressively treated with antibiotic. She is DO NOT RESUSCITATE/DO NOT INTUBATE.  Unfortunately missed the daughter, therefore majority of the history of present illness has been obtained from medical record. Patient is too unstable and unable to provide any history.   Inpatient Medications  . acetaminophen  650 mg Oral 3 times per day  . amiodarone  200 mg Oral Daily  . Chlorhexidine Gluconate Cloth  6 each Topical Q0600  . escitalopram  10 mg Oral Daily  . fluticasone  1 spray Each Nare Daily  . heparin subcutaneous  5,000 Units Subcutaneous 3 times per day  . ipratropium-albuterol  3 mL Nebulization Q6H  . latanoprost  1 drop Both Eyes QHS  . loratadine  10 mg Oral Daily  . mupirocin ointment  1 application Nasal BID  . pantoprazole  40 mg Oral BID  . piperacillin-tazobactam (ZOSYN)  IV  3.375 g Intravenous Q8H  . sodium chloride  1 spray Each Nare QID  . sodium chloride  3 mL Intravenous Q12H  . sodium chloride  3 mL Intravenous Q12H  . vancomycin  500 mg Intravenous Q24H    Family History Unable to obtain as patient cannot answer and unstable. And family not at bedside.  Social History Social History     Social History  . Marital Status: Unknown    Spouse Name: N/A  . Number of Children: 4  . Years of Education: N/A   Occupational History  . Retired    Social History Main Topics  . Smoking status: Never Smoker   . Smokeless tobacco: Never Used  . Alcohol Use: Yes  . Drug Use: No  . Sexual Activity: Not Currently   Other Topics Concern  . Not on file   Social History Narrative     Review of Systems  Unable to obtain ROS as patient is too SOB and too unstable to answer any question   Physical Exam  Blood pressure 111/83, pulse 71, temperature 97.8 F (36.6 C), temperature source Axillary, resp. rate 34, height 5' 6.14" (1.68 m), weight 120 lb (54.432 kg), SpO2 91 %.  General: appears to be in resp distress Neuro: Moves all extremities spontaneously. Does not appear to be oriented.  HEENT: Normal  Neck: Supple without bruits or JVD. Lungs: Bilateral rhonchi, on nonrebreather.  Heart: Tachycardic. Abdomen: Soft, non-tender, non-distended, BS + x 4.  Extremities: No clubbing, cyanosis or edema. DP/PT/Radials 2+ and equal bilaterally.  Labs   Recent Labs  02/06/15 1817 02/06/15 2319 08/04/15 0525  TROPONINI 0.54* 0.38* 0.31*   Lab Results  Component Value Date   WBC 24.1* 08/04/2015   HGB 10.8* 08/04/2015   HCT 31.8* 08/04/2015   MCV 92.2 08/04/2015   PLT 176 08/04/2015     Recent Labs Lab 08/04/15 1005  NA 132*  K 4.3  CL 99*  CO2 24  BUN 32*  CREATININE 1.29*  CALCIUM 9.0  PROT 6.0*  BILITOT 0.8  ALKPHOS 41  ALT 23  AST 35  GLUCOSE 151*    Radiology/Studies  Ct Abdomen Pelvis Wo Contrast  08/04/2015  CLINICAL DATA:  Status post fall at living center. Concern for chest or abdominal injury. Initial encounter. EXAM: CT CHEST, ABDOMEN AND PELVIS WITHOUT CONTRAST TECHNIQUE: Multidetector CT imaging of the chest, abdomen and pelvis was performed following the standard protocol without IV contrast. COMPARISON:  Chest radiograph performed  18-Oct-2014 FINDINGS: CT CHEST FINDINGS Small bilateral pleural effusions are noted, with diffuse patchy bilateral airspace opacification. This may reflect pulmonary edema or atypical infection. No pneumothorax is seen. Scattered coronary artery calcifications are seen. Scattered calcification is noted along the thoracic aorta. The mediastinum is otherwise unremarkable. No mediastinal lymphadenopathy is appreciated. No pericardial effusion is identified. The great vessels are grossly unremarkable. A left-sided chest port is noted with its leads ending at the right atrium and right ventricle. The thyroid gland is unremarkable in appearance. No axillary lymphadenopathy is seen. No acute osseous abnormalities are identified. CT ABDOMEN AND PELVIS FINDINGS The liver and spleen are unremarkable in appearance. The patient is status post cholecystectomy, with clips noted along the gallbladder fossa. The pancreas and adrenal glands are unremarkable. The kidneys are unremarkable in appearance. There is no evidence of hydronephrosis. No renal or ureteral stones are seen. No perinephric  stranding is appreciated. No free fluid is identified. The small bowel is unremarkable in appearance. The stomach is within normal limits. No acute vascular abnormalities are seen. Scattered calcification is noted along the abdominal aorta and its branches. The appendix is normal in caliber and contains air, without evidence of appendicitis. Mild diverticulosis is noted along the sigmoid colon, without evidence of diverticulitis. The bladder is decompressed, with a Foley catheter in place. The patient is status post hysterectomy. No suspicious adnexal masses are seen. No inguinal lymphadenopathy is seen. No acute osseous abnormalities are identified. There are chronic compression deformities involving vertebral bodies T12 and L4, without definite acute fracture. IMPRESSION: 1. No evidence of traumatic injury to the chest, abdomen or pelvis. 2.  Small bilateral pleural effusions, with diffuse patchy bilateral airspace opacification. This may reflect pulmonary edema or atypical infection. 3. Scattered coronary artery calcifications seen. 4. Scattered calcification along the abdominal aorta and its branches. 5. Mild diverticulosis along the sigmoid colon, without evidence of diverticulitis. 6. Chronic compression deformities involving vertebral bodies T12 and L4, without definite acute fracture. Electronically Signed   By: Roanna Raider M.D.   On: 08/04/2015 06:39   Ct Head Wo Contrast  08/04/2015  CLINICAL DATA:  Status post fall, with hypoxia. Concern for head injury. Initial encounter. EXAM: CT HEAD WITHOUT CONTRAST TECHNIQUE: Contiguous axial images were obtained from the base of the skull through the vertex without intravenous contrast. COMPARISON:  CT of the head performed 12/26/2013 FINDINGS: There is no evidence of acute infarction, mass lesion, or intra- or extra-axial hemorrhage on CT. Prominence of the ventricles and sulci reflects mild to moderate cortical volume loss. Mild cerebellar atrophy is noted. Scattered periventricular and subcortical white matter change likely reflects small vessel ischemic microangiopathy. The brainstem and fourth ventricle are within normal limits. The basal ganglia are unremarkable in appearance. The cerebral hemispheres demonstrate grossly normal gray-white differentiation. No mass effect or midline shift is seen. There is no evidence of fracture; visualized osseous structures are unremarkable in appearance. The orbits are within normal limits. The paranasal sinuses and mastoid air cells are well-aerated. No significant soft tissue abnormalities are seen. IMPRESSION: 1. No evidence of traumatic intracranial injury or fracture. 2. Mild to moderate cortical volume loss and scattered small vessel ischemic microangiopathy. Electronically Signed   By: Roanna Raider M.D.   On: 08/04/2015 06:33   Ct Chest Wo  Contrast  08/04/2015  CLINICAL DATA:  Status post fall at living center. Concern for chest or abdominal injury. Initial encounter. EXAM: CT CHEST, ABDOMEN AND PELVIS WITHOUT CONTRAST TECHNIQUE: Multidetector CT imaging of the chest, abdomen and pelvis was performed following the standard protocol without IV contrast. COMPARISON:  Chest radiograph performed 2015-08-28 FINDINGS: CT CHEST FINDINGS Small bilateral pleural effusions are noted, with diffuse patchy bilateral airspace opacification. This may reflect pulmonary edema or atypical infection. No pneumothorax is seen. Scattered coronary artery calcifications are seen. Scattered calcification is noted along the thoracic aorta. The mediastinum is otherwise unremarkable. No mediastinal lymphadenopathy is appreciated. No pericardial effusion is identified. The great vessels are grossly unremarkable. A left-sided chest port is noted with its leads ending at the right atrium and right ventricle. The thyroid gland is unremarkable in appearance. No axillary lymphadenopathy is seen. No acute osseous abnormalities are identified. CT ABDOMEN AND PELVIS FINDINGS The liver and spleen are unremarkable in appearance. The patient is status post cholecystectomy, with clips noted along the gallbladder fossa. The pancreas and adrenal glands are unremarkable. The kidneys are unremarkable  in appearance. There is no evidence of hydronephrosis. No renal or ureteral stones are seen. No perinephric stranding is appreciated. No free fluid is identified. The small bowel is unremarkable in appearance. The stomach is within normal limits. No acute vascular abnormalities are seen. Scattered calcification is noted along the abdominal aorta and its branches. The appendix is normal in caliber and contains air, without evidence of appendicitis. Mild diverticulosis is noted along the sigmoid colon, without evidence of diverticulitis. The bladder is decompressed, with a Foley catheter in place.  The patient is status post hysterectomy. No suspicious adnexal masses are seen. No inguinal lymphadenopathy is seen. No acute osseous abnormalities are identified. There are chronic compression deformities involving vertebral bodies T12 and L4, without definite acute fracture. IMPRESSION: 1. No evidence of traumatic injury to the chest, abdomen or pelvis. 2. Small bilateral pleural effusions, with diffuse patchy bilateral airspace opacification. This may reflect pulmonary edema or atypical infection. 3. Scattered coronary artery calcifications seen. 4. Scattered calcification along the abdominal aorta and its branches. 5. Mild diverticulosis along the sigmoid colon, without evidence of diverticulitis. 6. Chronic compression deformities involving vertebral bodies T12 and L4, without definite acute fracture. Electronically Signed   By: Roanna Raider M.D.   On: 08/04/2015 06:39   Dg Chest Port 1 View  07/17/2015  CLINICAL DATA:  Shortness of breath.  Sore throat.  Hypoxia. EXAM: PORTABLE CHEST 1 VIEW COMPARISON:  12/29/2013 FINDINGS: New prominently reverse lordotic projection likely due to kyphosis. Dual lead pacer in place, although distal lead tip is below the inferior margin of today's image. Moderate enlargement of the cardiopericardial silhouette. Atherosclerotic aortic arch. Patchy bilateral airspace opacities especially in the perihilar and basilar regions. Indistinct obscuration of the hemidiaphragms, right greater the left. IMPRESSION: 1. Indistinct vasculature with bilateral airspace opacities and enlargement of the cardiopericardial silhouette, favoring acute pulmonary edema. 2. Atherosclerotic aortic arch. 3. Dual lead pacer in place. 4. Cannot exclude layering pleural effusions. Electronically Signed   By: Gaylyn Rong M.D.   On: 07/25/2015 16:50    ECG  Sinus tachycardia, with pacing spikes.  ASSESSMENT AND PLAN  1. Severe sepsis with PNA and UTI  - Patient does not appear well, and  her condition likely will deteriorate. WBC continue trending up, now 24  - In order to perform TEE, she will either need to become more stable or become intubated. Unfortunately, she likely will not get better. Would not recommend TEE in this case  - I have tried to contact the daughter 5 times however unable to reach her.   2. Elevated LVOT: likely has some degree of HOCM combining with stress from infection  - she need volume, will continue current 135ml/HR IVF  - EF normal on echo 08/04/2015. Mild to moderate MR. Mild to moderate AI. PA peak pressure  3. Fall: CT of hear negative  4. PAF on amiodarone: it appears she was taken off of coumadin since last year  5. Chronic diastolic heart failure: Not clinically in heart failure.  6. Hyperlipidemia 7. Hypertension 8. history of complete heart block status post Medtronic dual-chamber pacemaker in 12/2013   SignedAzalee Course, [place] 08/04/2015, 1:26 PM   The patient was seen and examined, and I agree with the assessment and plan as documented above. Case discussed extensively with Azalee Course PA-C.  Patient's prognosis is very poor with low likelihood of recovery. She is in acute hypoxemic respiratory failure with ARDS/sepsis picture with elevated lactate and GPC in clusters with  blood cultures. She is also DNR/DNI code status. WBC continues to rise in spite of broad-spectrum antibiotics. She is not in heart failure but appears dehydrated and is receiving IV fluids. Not suitable for TEE at this time. Recommend palliative care discussion. Harrell Lark has tried on multiple occasions to reach daughter by phone but was unsuccessful.   Prentice Docker, MD, [facility]  08/04/2015 1:39 PM

## 2015-08-04 NOTE — Progress Notes (Signed)
Attempted to pull out iv line and oxygen, mittens applied bil., md aware. Ativan 0.5 mg iv given, continue  to monitor.

## 2015-08-05 ENCOUNTER — Encounter (HOSPITAL_COMMUNITY): Payer: Self-pay | Admitting: Physician Assistant

## 2015-08-05 DIAGNOSIS — B9561 Methicillin susceptible Staphylococcus aureus infection as the cause of diseases classified elsewhere: Secondary | ICD-10-CM

## 2015-08-05 DIAGNOSIS — A419 Sepsis, unspecified organism: Secondary | ICD-10-CM

## 2015-08-05 DIAGNOSIS — A4102 Sepsis due to Methicillin resistant Staphylococcus aureus: Secondary | ICD-10-CM

## 2015-08-05 DIAGNOSIS — I509 Heart failure, unspecified: Secondary | ICD-10-CM

## 2015-08-05 DIAGNOSIS — R7881 Bacteremia: Secondary | ICD-10-CM | POA: Insufficient documentation

## 2015-08-05 DIAGNOSIS — I5031 Acute diastolic (congestive) heart failure: Secondary | ICD-10-CM

## 2015-08-05 LAB — CBC
HCT: 29.1 % — ABNORMAL LOW (ref 36.0–46.0)
HEMOGLOBIN: 9.3 g/dL — AB (ref 12.0–15.0)
MCH: 30.1 pg (ref 26.0–34.0)
MCHC: 32 g/dL (ref 30.0–36.0)
MCV: 94.2 fL (ref 78.0–100.0)
PLATELETS: 171 10*3/uL (ref 150–400)
RBC: 3.09 MIL/uL — ABNORMAL LOW (ref 3.87–5.11)
RDW: 14.6 % (ref 11.5–15.5)
WBC: 18.4 10*3/uL — ABNORMAL HIGH (ref 4.0–10.5)

## 2015-08-05 LAB — GLUCOSE, CAPILLARY
GLUCOSE-CAPILLARY: 140 mg/dL — AB (ref 65–99)
Glucose-Capillary: 121 mg/dL — ABNORMAL HIGH (ref 65–99)
Glucose-Capillary: 132 mg/dL — ABNORMAL HIGH (ref 65–99)
Glucose-Capillary: 134 mg/dL — ABNORMAL HIGH (ref 65–99)
Glucose-Capillary: 138 mg/dL — ABNORMAL HIGH (ref 65–99)
Glucose-Capillary: 140 mg/dL — ABNORMAL HIGH (ref 65–99)
Glucose-Capillary: 155 mg/dL — ABNORMAL HIGH (ref 65–99)

## 2015-08-05 LAB — URINE CULTURE
Culture: NO GROWTH
SPECIAL REQUESTS: NORMAL

## 2015-08-05 LAB — COMPREHENSIVE METABOLIC PANEL
ALK PHOS: 38 U/L (ref 38–126)
ALT: 24 U/L (ref 14–54)
ANION GAP: 9 (ref 5–15)
AST: 34 U/L (ref 15–41)
Albumin: 2.6 g/dL — ABNORMAL LOW (ref 3.5–5.0)
BUN: 45 mg/dL — ABNORMAL HIGH (ref 6–20)
CALCIUM: 8.7 mg/dL — AB (ref 8.9–10.3)
CO2: 21 mmol/L — AB (ref 22–32)
Chloride: 104 mmol/L (ref 101–111)
Creatinine, Ser: 1.48 mg/dL — ABNORMAL HIGH (ref 0.44–1.00)
GFR calc non Af Amer: 30 mL/min — ABNORMAL LOW (ref >60)
GFR, EST AFRICAN AMERICAN: 35 mL/min — AB (ref >60)
Glucose, Bld: 171 mg/dL — ABNORMAL HIGH (ref 65–99)
POTASSIUM: 4.1 mmol/L (ref 3.5–5.1)
SODIUM: 134 mmol/L — AB (ref 135–145)
Total Bilirubin: 1 mg/dL (ref 0.3–1.2)
Total Protein: 5.4 g/dL — ABNORMAL LOW (ref 6.5–8.1)

## 2015-08-05 MED ORDER — CEFAZOLIN SODIUM 1-5 GM-% IV SOLN
1.0000 g | Freq: Two times a day (BID) | INTRAVENOUS | Status: DC
  Administered 2015-08-05: 1 g via INTRAVENOUS
  Filled 2015-08-05 (×2): qty 50

## 2015-08-05 MED ORDER — HYDROMORPHONE HCL 1 MG/ML IJ SOLN
0.5000 mg | INTRAMUSCULAR | Status: DC | PRN
  Administered 2015-08-05 – 2015-08-06 (×2): 0.5 mg via INTRAVENOUS
  Filled 2015-08-05 (×2): qty 1

## 2015-08-05 MED ORDER — GLYCOPYRROLATE 0.2 MG/ML IJ SOLN
0.2000 mg | INTRAMUSCULAR | Status: DC | PRN
  Filled 2015-08-05: qty 2

## 2015-08-05 MED ORDER — HALOPERIDOL LACTATE 2 MG/ML PO CONC
1.0000 mg | Freq: Four times a day (QID) | ORAL | Status: DC | PRN
  Filled 2015-08-05: qty 1

## 2015-08-05 MED ORDER — LORAZEPAM 2 MG/ML IJ SOLN: 0.5000 mg | INTRAMUSCULAR | Status: DC | PRN

## 2015-08-05 NOTE — Progress Notes (Signed)
Restless, tugging her foley cath. Noted urine with blood tinged. and attempted to pull out iv line. Repositioned for comfort, ativan 0.5mg  iv given , continue to monitor.

## 2015-08-05 NOTE — Progress Notes (Signed)
Patient's daughter [facility] informed of the patient's new room number.

## 2015-08-05 NOTE — Progress Notes (Signed)
Reminded Resp. Therapist regarding the ABG to be drawn, claimed to have somebody to do it.

## 2015-08-05 NOTE — Progress Notes (Signed)
SUBJECTIVE: The patient is doing well today.  At this time the patient is confused, is on BPAP.  Marland Kitchen. amiodarone  200 mg Oral Daily  . antiseptic oral rinse  7 mL Mouth Rinse q12n4p  . chlorhexidine  15 mL Mouth Rinse BID  . Chlorhexidine Gluconate Cloth  6 each Topical Q0600  . escitalopram  10 mg Oral Daily  . fluticasone  1 spray Each Nare Daily  . heparin subcutaneous  5,000 Units Subcutaneous 3 times per day  . latanoprost  1 drop Both Eyes QHS  . loratadine  10 mg Oral Daily  . mupirocin ointment  1 application Nasal BID  . pantoprazole (PROTONIX) IV  40 mg Intravenous Q12H  . piperacillin-tazobactam (ZOSYN)  IV  3.375 g Intravenous Q8H  . sodium chloride  1 spray Each Nare QID  . sodium chloride  3 mL Intravenous Q12H  . sodium chloride  3 mL Intravenous Q12H  . vancomycin  500 mg Intravenous Q24H   . sodium chloride 10 mL/hr at 08/04/15 2026    OBJECTIVE: Physical Exam: Filed Vitals:   08/05/15 0400 08/05/15 0500 08/05/15 0600 08/05/15 0741  BP: 105/57   91/47  Pulse: 82 91 107   Temp:      TempSrc:      Resp: 19 20 26    Height:      Weight:      SpO2: 99% 89% 84%     Intake/Output Summary (Last 24 hours) at 08/05/15 0749 Last data filed at 08/05/15 0700  Gross per 24 hour  Intake 1655.67 ml  Output   1100 ml  Net 555.67 ml    Telemetry reveals sinus rhythm, V pacing, infrequent sensing   GEN- The patient is elderly, frail, ill appearing, confused answers "I don't know" to my questions Head- normocephalic, atraumatic Eyes-  Sclera clear, conjunctiva pink Ears- hearing intact Lungs- b/l ronchi, on BIPAP Heart- Regular rate and rhythm,  GI- soft,  Extremities- no clubbing, cyanosis, or edema Skin- no rash or lesion, skin is warm and dry Neuro: confused  LABS: Basic Metabolic Panel:  Recent Labs  96/12/5406-22-2016 1817 08/04/15 1005 08/05/15 0215  NA  --  132* 134*  K  --  4.3 4.1  CL  --  99* 104  CO2  --  24 21*  GLUCOSE  --  151* 171*  BUN  --   32* 45*  CREATININE  --  1.29* 1.48*  CALCIUM  --  9.0 8.7*  MG 1.6*  --   --   PHOS 2.3*  --   --    Liver Function Tests:  Recent Labs  08/04/15 1005 08/05/15 0215  AST 35 34  ALT 23 24  ALKPHOS 41 38  BILITOT 0.8 1.0  PROT 6.0* 5.4*  ALBUMIN 2.8* 2.6*   No results for input(s): LIPASE, AMYLASE in the last 72 hours. CBC:  Recent Labs  05-02-2015 1547  08/04/15 1005 08/05/15 0215  WBC 20.7*  < > 24.1* 18.4*  NEUTROABS 17.6*  --   --   --   HGB 10.2*  < > 10.8* 9.3*  HCT 29.6*  < > 31.8* 29.1*  MCV 91.6  < > 92.2 94.2  PLT 140*  < > 176 171  < > = values in this interval not displayed. Cardiac Enzymes:  Recent Labs  05-02-2015 1817 05-02-2015 2319 08/04/15 0525  TROPONINI 0.54* 0.38* 0.31*   BNP: Invalid input(s): POCBNP D-Dimer: No results for input(s): DDIMER in the  last 72 hours. Hemoglobin A1C: No results for input(s): HGBA1C in the last 72 hours. Fasting Lipid Panel: No results for input(s): CHOL, HDL, LDLCALC, TRIG, CHOLHDL, LDLDIRECT in the last 72 hours. Thyroid Function Tests:  Recent Labs  21-Aug-2015 1811  TSH 0.331*   RADIOLOGY: Ct Abdomen Pelvis Wo Contrast 08/04/2015  CLINICAL DATA:  Status post fall at living center. Concern for chest or abdominal injury. Initial encounter. EXAM: CT CHEST, ABDOMEN AND PELVIS WITHOUT CONTRAST TECHNIQUE: Multidetector CT imaging of the chest, abdomen and pelvis was performed following the standard protocol without IV contrast. COMPARISON:  Chest radiograph performed Aug 21, 2015 FINDINGS: CT CHEST FINDINGS Small bilateral pleural effusions are noted, with diffuse patchy bilateral airspace opacification. This may reflect pulmonary edema or atypical infection. No pneumothorax is seen. Scattered coronary artery calcifications are seen. Scattered calcification is noted along the thoracic aorta. The mediastinum is otherwise unremarkable. No mediastinal lymphadenopathy is appreciated. No pericardial effusion is identified. The  great vessels are grossly unremarkable. A left-sided chest port is noted with its leads ending at the right atrium and right ventricle. The thyroid gland is unremarkable in appearance. No axillary lymphadenopathy is seen. No acute osseous abnormalities are identified. CT ABDOMEN AND PELVIS FINDINGS The liver and spleen are unremarkable in appearance. The patient is status post cholecystectomy, with clips noted along the gallbladder fossa. The pancreas and adrenal glands are unremarkable. The kidneys are unremarkable in appearance. There is no evidence of hydronephrosis. No renal or ureteral stones are seen. No perinephric stranding is appreciated. No free fluid is identified. The small bowel is unremarkable in appearance. The stomach is within normal limits. No acute vascular abnormalities are seen. Scattered calcification is noted along the abdominal aorta and its branches. The appendix is normal in caliber and contains air, without evidence of appendicitis. Mild diverticulosis is noted along the sigmoid colon, without evidence of diverticulitis. The bladder is decompressed, with a Foley catheter in place. The patient is status post hysterectomy. No suspicious adnexal masses are seen. No inguinal lymphadenopathy is seen. No acute osseous abnormalities are identified. There are chronic compression deformities involving vertebral bodies T12 and L4, without definite acute fracture. IMPRESSION: 1. No evidence of traumatic injury to the chest, abdomen or pelvis. 2. Small bilateral pleural effusions, with diffuse patchy bilateral airspace opacification. This may reflect pulmonary edema or atypical infection. 3. Scattered coronary artery calcifications seen. 4. Scattered calcification along the abdominal aorta and its branches. 5. Mild diverticulosis along the sigmoid colon, without evidence of diverticulitis. 6. Chronic compression deformities involving vertebral bodies T12 and L4, without definite acute fracture.  Electronically Signed   By: Roanna Raider M.D.   On: 08/04/2015 06:39   Ct Head Wo Contrast 08/04/2015  CLINICAL DATA:  Status post fall, with hypoxia. Concern for head injury. Initial encounter. EXAM: CT HEAD WITHOUT CONTRAST TECHNIQUE: Contiguous axial images were obtained from the base of the skull through the vertex without intravenous contrast. COMPARISON:  CT of the head performed 12/26/2013 FINDINGS: There is no evidence of acute infarction, mass lesion, or intra- or extra-axial hemorrhage on CT. Prominence of the ventricles and sulci reflects mild to moderate cortical volume loss. Mild cerebellar atrophy is noted. Scattered periventricular and subcortical white matter change likely reflects small vessel ischemic microangiopathy. The brainstem and fourth ventricle are within normal limits. The basal ganglia are unremarkable in appearance. The cerebral hemispheres demonstrate grossly normal gray-white differentiation. No mass effect or midline shift is seen. There is no evidence of fracture; visualized osseous structures are  unremarkable in appearance. The orbits are within normal limits. The paranasal sinuses and mastoid air cells are well-aerated. No significant soft tissue abnormalities are seen. IMPRESSION: 1. No evidence of traumatic intracranial injury or fracture. 2. Mild to moderate cortical volume loss and scattered small vessel ischemic microangiopathy. Electronically Signed   By: Roanna Raider M.D.   On: 08/04/2015 06:33   Ct Chest Wo Contrast 08/04/2015  CLINICAL DATA:  Status post fall at living center. Concern for chest or abdominal injury. Initial encounter. EXAM: CT CHEST, ABDOMEN AND PELVIS WITHOUT CONTRAST TECHNIQUE: Multidetector CT imaging of the chest, abdomen and pelvis was performed following the standard protocol without IV contrast. COMPARISON:  Chest radiograph performed 08-28-15 FINDINGS: CT CHEST FINDINGS Small bilateral pleural effusions are noted, with diffuse patchy  bilateral airspace opacification. This may reflect pulmonary edema or atypical infection. No pneumothorax is seen. Scattered coronary artery calcifications are seen. Scattered calcification is noted along the thoracic aorta. The mediastinum is otherwise unremarkable. No mediastinal lymphadenopathy is appreciated. No pericardial effusion is identified. The great vessels are grossly unremarkable. A left-sided chest port is noted with its leads ending at the right atrium and right ventricle. The thyroid gland is unremarkable in appearance. No axillary lymphadenopathy is seen. No acute osseous abnormalities are identified. CT ABDOMEN AND PELVIS FINDINGS The liver and spleen are unremarkable in appearance. The patient is status post cholecystectomy, with clips noted along the gallbladder fossa. The pancreas and adrenal glands are unremarkable. The kidneys are unremarkable in appearance. There is no evidence of hydronephrosis. No renal or ureteral stones are seen. No perinephric stranding is appreciated. No free fluid is identified. The small bowel is unremarkable in appearance. The stomach is within normal limits. No acute vascular abnormalities are seen. Scattered calcification is noted along the abdominal aorta and its branches. The appendix is normal in caliber and contains air, without evidence of appendicitis. Mild diverticulosis is noted along the sigmoid colon, without evidence of diverticulitis. The bladder is decompressed, with a Foley catheter in place. The patient is status post hysterectomy. No suspicious adnexal masses are seen. No inguinal lymphadenopathy is seen. No acute osseous abnormalities are identified. There are chronic compression deformities involving vertebral bodies T12 and L4, without definite acute fracture. IMPRESSION: 1. No evidence of traumatic injury to the chest, abdomen or pelvis. 2. Small bilateral pleural effusions, with diffuse patchy bilateral airspace opacification. This may reflect  pulmonary edema or atypical infection. 3. Scattered coronary artery calcifications seen. 4. Scattered calcification along the abdominal aorta and its branches. 5. Mild diverticulosis along the sigmoid colon, without evidence of diverticulitis. 6. Chronic compression deformities involving vertebral bodies T12 and L4, without definite acute fracture. Electronically Signed   By: Roanna Raider M.D.   On: 08/04/2015 06:39   Dg Chest Port 1 View 08-28-2015  CLINICAL DATA:  Shortness of breath.  Sore throat.  Hypoxia. EXAM: PORTABLE CHEST 1 VIEW COMPARISON:  12/29/2013 FINDINGS: New prominently reverse lordotic projection likely due to kyphosis. Dual lead pacer in place, although distal lead tip is below the inferior margin of today's image. Moderate enlargement of the cardiopericardial silhouette. Atherosclerotic aortic arch. Patchy bilateral airspace opacities especially in the perihilar and basilar regions. Indistinct obscuration of the hemidiaphragms, right greater the left. IMPRESSION: 1. Indistinct vasculature with bilateral airspace opacities and enlargement of the cardiopericardial silhouette, favoring acute pulmonary edema. 2. Atherosclerotic aortic arch. 3. Dual lead pacer in place. 4. Cannot exclude layering pleural effusions. Electronically Signed   By: Annitta Needs.D.  On: 08/08/2015 16:50    ASSESSMENT AND PLAN:  Principal Problem:   CHF (congestive heart failure) (HCC) Active Problems:   Complete heart block (HCC)   Hypertension   Atrial fibrillation (HCC)   Sepsis (HCC)   AKI (acute kidney injury) (HCC)  1.  Severe sepsis, UTI, pneumonia, leukocytosis      reported fever by EMS 104, GPC bacteremia      Respiratory failure on BIPAP      Acute renal injury  2. PAFib and CHB by history, pt with MDT PPM     Chart notes recent recurrent falls     On amiodarone and coumadin though held a/c here  3. Hx of chronic diastolic CHF     Felt to be volume depleted on admission, s/p  IVF   PLAN: Check her PPM             Prior cardiology visit noted, patient not felt to be TEE candidate at this time             await further discussion with family regarding mention of palliative care eval, will await prior to further recommendations  Francis Dowse, PA-C 08/05/2015 7:49 AM   EP Attending  Patient seen and examined. On my exam, she is encephalopathic, does not follow commands or answers questions and is combative. At this point, she is not a candidate for any invasive procedures (like lead extraction) although IV anti-biotics transitioned to suppressive anti-biotics long term is reasonable. If she stabilizes, would restart amiodarone orally. We will sign off for now, please call for questions.   Leonia Reeves.D.

## 2015-08-05 NOTE — Progress Notes (Signed)
[facility] PROGRESS NOTE  Julie Serrano WUJ:[id number] DOB: September 17, 1924 DOA: 07/30/2015 PCP: Garvin Fila, MD  Length of stay: 2   Assessment/Plan: Principal Problem:   CHF (congestive heart failure) (HCC) Active Problems:   Complete heart block (HCC)   Hypertension   Atrial fibrillation (HCC)   Sepsis (HCC)   AKI (acute kidney injury) [facility])    Brief summary 81 year old female with a history of paroxysmal atrial fibrillation, hypertension, dyslipidemia, cholelithiasis with dilated common bile duct, complete heart block status post pacemaker followed by Dr. Ladona Ridgel of cardiology, currently at a long-term care facility, who presents to the ER for worsening shortness of breath since Sunday. Patient is on a nonrebreather and her daughter provides most of the history. Patient started developing generalized weakness and lightheadedness, and had a fall on Sunday, fell backwards and hit the back of her head. She had blood work done at the nursing home that revealed that the patient possibly have a UTI, started on Levaquin 250 mg a day on Sunday. However patient shortness of breath progressively got worse, associated with a productive cough. Brought in by EMS and was found to have a temperature of 104, oxygen saturation of 82% on room air, initial blood pressure 1 19 /74. Heart rate 106, 84% on 5 L. Patient subsequently placed on a nonrebreather. Fund of a sodium of 125, acute kidney injury with a creatinine of 1.56, glucose 203, [id number]. Troponin 0.55. Lactic acid 3.58. White blood cell count of 20.7. Patient is being admitted for sepsis to step down. Chest x-ray shows indistinct opacity, pneumonia versus CHF   Assessment and plan Acute Hypoxemic respiratory failure Secondary to acute CHF exacerbation versus healthcare associated associated pneumonia vs sepsis ABG consistent with hypoxemic respiratory failure, blood cultures positive for gram-positive cocci in clusters ,  pro-calcitonin 0.25, lactic acid elevated Continue broad-spectrum antibiotics Requiring BiPAP support, now on [place] ,   Continue step down Prognosis poor , discussed with the patient's daughter  palliative care consultation oredred ,cannot have work up for bacteremia , or TEE    HCAP-patient started on vancomycin and Zosyn, blood culture positive for gram-positive clusters, CT scan of the chest chest, abdomen, pelvis showed small bilateral pleural effusions with diffuse patchy bilateral airspace opacification consistent with pneumonia. White count  Better ,cont broad-spectrum antibiotics, await speciation on BC    Chronic diastolic CHF (congestive heart failure) without exacerbation (HCC)-BNP elevated, however patient appears to be dehydrated clinically and acute kidney injury Will hold off on diuresis, 2-D echo showed EF of 55-60% with normal wall motion Continue gentle IV fluids   Recurrent falls-likely in the setting of dehydration and orthostasis, CT of the head ordered to rule out head trauma in the setting of 2 recent falls   Complete heart block (HCC)-patient has a pacemaker, abnormal troponin, likely in the setting of sepsis/ AKI ,   cardiology evaluated  pacemaker in the setting of positive blood cultures.Prior cardiology visit noted, patient not felt to be TEE candidate at this time.await further discussion with family regarding mention of palliative care eval, will await prior to further recommendations    Hypertension-hold antihypertensive medications at this time as BP soft, continue IV fluids   Atrial fibrillation (HCC)-rate controlled, continue amiodarone, not on anticoagulation, but on coumadin at home ?   Sepsis (HCC)-sepsis protocol initiated, follow serial pro-calcitonin, lactic acid, patient started on broad-spectrum antibiotics,GRAM POSITIVE COCCI IN CLUSTERS, speciation pending    AKI (acute kidney injury) (HCC)-Baseline creatinine  around 1.0, slight improvement  from 1.56> 1.29>1.48    DVT prophylaxsis heparin  Code Status:      Code Status Orders        Start     Ordered   08/04/15 1112  Do not attempt resuscitation (DNR)   Continuous    Question Answer Comment  In the event of cardiac or respiratory ARREST Do not call a "code blue"   In the event of cardiac or respiratory ARREST Do not perform Intubation, CPR, defibrillation or ACLS   In the event of cardiac or respiratory ARREST Use medication by any route, position, wound care, and other measures to relive pain and suffering. May use oxygen, suction and manual treatment of airway obstruction as needed for comfort.      08/04/15 1111    Advance Directive Documentation        Most Recent Value   Type of Advance Directive  Living will   Pre-existing out of facility DNR order (yellow form or pink MOST form)     "MOST" Form in Place?       Family Communication: family updated about patient's clinical progress Disposition Plan:  As above      Consultants:  None  Procedures:  None  Antibiotics: Anti-infectives    Start     Dose/Rate Route Frequency Ordered Stop   08/05/15 1400  ceFAZolin (ANCEF) IVPB 1 g/50 mL premix     1 g 100 mL/hr over 30 Minutes Intravenous Every 12 hours 08/05/15 1101     08/04/15 1700  vancomycin (VANCOCIN) 500 mg in sodium chloride 0.9 % 100 mL IVPB  Status:  Discontinued     500 mg 100 mL/hr over 60 Minutes Intravenous Every 12 hours 08/08/15 1946 08/04/15 0938   08/04/15 1700  vancomycin (VANCOCIN) 500 mg in sodium chloride 0.9 % 100 mL IVPB     500 mg 100 mL/hr over 60 Minutes Intravenous Every 24 hours 08/04/15 0938     08/04/15 1000  oseltamivir (TAMIFLU) capsule 30 mg  Status:  Discontinued     30 mg Oral Daily 08-08-15 1946 08/04/15 0840   08/08/15 2230  piperacillin-tazobactam (ZOSYN) IVPB 3.375 g  Status:  Discontinued     3.375 g 12.5 mL/hr over 240 Minutes Intravenous Every 8 hours August 08, 2015 1946 08/05/15 1051   08/08/2015 2200   oseltamivir (TAMIFLU) capsule 30 mg  Status:  Discontinued     30 mg Oral 2 times daily 08/08/15 1550 08-Aug-2015 1555   08-Aug-2015 1745  piperacillin-tazobactam (ZOSYN) IVPB 3.375 g  Status:  Discontinued     3.375 g 100 mL/hr over 30 Minutes Intravenous  Once 2015/08/08 1736 August 08, 2015 1755   August 08, 2015 1745  vancomycin (VANCOCIN) IVPB 1000 mg/200 mL premix  Status:  Discontinued     1,000 mg 200 mL/hr over 60 Minutes Intravenous  Once 08-08-15 1736 Aug 08, 2015 1754   08-08-2015 1700  oseltamivir (TAMIFLU) capsule 30 mg     30 mg Oral  Once 08/08/2015 1555 08-08-2015 1608   08/08/2015 1630  piperacillin-tazobactam (ZOSYN) IVPB 3.375 g     3.375 g 100 mL/hr over 30 Minutes Intravenous  Once 08-08-2015 1619 08/08/2015 1706   2015-08-08 1630  vancomycin (VANCOCIN) IVPB 1000 mg/200 mL premix     1,000 mg 200 mL/hr over 60 Minutes Intravenous  Once 08-08-2015 1619 08-Aug-2015 1806   August 08, 2015 1600  vancomycin (VANCOCIN) IVPB 1000 mg/200 mL premix  Status:  Discontinued     1,000 mg 200 mL/hr over 60 Minutes  Intravenous  Once 08/07/2015 1553 07/31/2015 1556   07/17/2015 1600  piperacillin-tazobactam (ZOSYN) IVPB 3.375 g  Status:  Discontinued     3.375 g 100 mL/hr over 30 Minutes Intravenous  Once 07/12/2015 1553 07/19/2015 1556         HPI/Subjective: Off  BiPAP,nonverbal,  overall prognosis appears to be poor  Objective: Filed Vitals:   08/05/15 0600 08/05/15 0741 08/05/15 0804 08/05/15 0942  BP:  91/47 102/59   Pulse: 107  91 95  Temp:   98.2 F (36.8 C)   TempSrc:   Axillary   Resp: 26  19 15   Height:      Weight:      SpO2: 84%  84% 98%    Intake/Output Summary (Last 24 hours) at 08/05/15 1140 Last data filed at 08/05/15 1000  Gross per 24 hour  Intake 1285.67 ml  Output   1100 ml  Net 185.67 ml    Exam:  GEN- The patient is elderly, frail, ill appearing, confused answers "I don't know" to my questions Head- normocephalic, atraumatic Eyes- Sclera clear, conjunctiva pink Ears- hearing intact Lungs-  b/l ronchi, on BIPAP Heart- Regular rate and rhythm,  GI- soft,  Extremities- no clubbing, cyanosis, or edema Skin- no rash or lesion, skin is warm and dry Neuro: confused   Data Review   Micro Results Recent Results (from the past 240 hour(s))  Culture, blood (routine x 2)     Status: None (Preliminary result)   Collection Time: 07/24/2015  3:47 PM  Result Value Ref Range Status   Specimen Description BLOOD RIGHT FOREARM  Final   Special Requests BOTTLES DRAWN AEROBIC AND ANAEROBIC 5CC  Final   Culture  Setup Time   Final    GRAM POSITIVE COCCI IN CLUSTERS IN BOTH AEROBIC AND ANAEROBIC BOTTLES CRITICAL RESULT CALLED TO, READ BACK BY AND VERIFIED WITH: T [email or web address]  08/04/15 MKELLY    Culture STAPHYLOCOCCUS AUREUS  Final   Report Status PENDING  Incomplete  Culture, blood (routine x 2)     Status: None (Preliminary result)   Collection Time: 07/15/2015  3:59 PM  Result Value Ref Range Status   Specimen Description BLOOD LEFT ANTECUBITAL  Final   Special Requests BOTTLES DRAWN AEROBIC AND ANAEROBIC 5CC  Final   Culture  Setup Time   Final    GRAM POSITIVE COCCI IN CLUSTERS IN BOTH AEROBIC AND ANAEROBIC BOTTLES CRITICAL RESULT CALLED TO, READ BACK BY AND VERIFIED WITH: T OLEARY 08/04/15 @ 0814 M VESTAL    Culture STAPHYLOCOCCUS AUREUS  Final   Report Status PENDING  Incomplete  Urine culture     Status: None   Collection Time: 08/02/2015  5:11 PM  Result Value Ref Range Status   Specimen Description URINE, CATHETERIZED  Final   Special Requests Normal  Final   Culture NO GROWTH 2 DAYS  Final   Report Status 08/05/2015 FINAL  Final  MRSA PCR Screening     Status: Abnormal   Collection Time: 08/04/2015  6:43 PM  Result Value Ref Range Status   MRSA by PCR POSITIVE (A) NEGATIVE Final    Comment:        The GeneXpert MRSA Assay (FDA approved for NASAL specimens only), is one component of a comprehensive MRSA colonization surveillance program. It is not intended to diagnose  MRSA infection nor to guide or monitor treatment for MRSA infections. RESULT CALLED TO, READ BACK BY AND VERIFIED WITH: Claire Shown RN 2025 07/24/2015 A BROWNING  Radiology Reports Ct Abdomen Pelvis Wo Contrast  08/04/2015  CLINICAL DATA:  Status post fall at living center. Concern for chest or abdominal injury. Initial encounter. EXAM: CT CHEST, ABDOMEN AND PELVIS WITHOUT CONTRAST TECHNIQUE: Multidetector CT imaging of the chest, abdomen and pelvis was performed following the standard protocol without IV contrast. COMPARISON:  Chest radiograph performed 07/19/2015 FINDINGS: CT CHEST FINDINGS Small bilateral pleural effusions are noted, with diffuse patchy bilateral airspace opacification. This may reflect pulmonary edema or atypical infection. No pneumothorax is seen. Scattered coronary artery calcifications are seen. Scattered calcification is noted along the thoracic aorta. The mediastinum is otherwise unremarkable. No mediastinal lymphadenopathy is appreciated. No pericardial effusion is identified. The great vessels are grossly unremarkable. A left-sided chest port is noted with its leads ending at the right atrium and right ventricle. The thyroid gland is unremarkable in appearance. No axillary lymphadenopathy is seen. No acute osseous abnormalities are identified. CT ABDOMEN AND PELVIS FINDINGS The liver and spleen are unremarkable in appearance. The patient is status post cholecystectomy, with clips noted along the gallbladder fossa. The pancreas and adrenal glands are unremarkable. The kidneys are unremarkable in appearance. There is no evidence of hydronephrosis. No renal or ureteral stones are seen. No perinephric stranding is appreciated. No free fluid is identified. The small bowel is unremarkable in appearance. The stomach is within normal limits. No acute vascular abnormalities are seen. Scattered calcification is noted along the abdominal aorta and its branches. The appendix is normal in  caliber and contains air, without evidence of appendicitis. Mild diverticulosis is noted along the sigmoid colon, without evidence of diverticulitis. The bladder is decompressed, with a Foley catheter in place. The patient is status post hysterectomy. No suspicious adnexal masses are seen. No inguinal lymphadenopathy is seen. No acute osseous abnormalities are identified. There are chronic compression deformities involving vertebral bodies T12 and L4, without definite acute fracture. IMPRESSION: 1. No evidence of traumatic injury to the chest, abdomen or pelvis. 2. Small bilateral pleural effusions, with diffuse patchy bilateral airspace opacification. This may reflect pulmonary edema or atypical infection. 3. Scattered coronary artery calcifications seen. 4. Scattered calcification along the abdominal aorta and its branches. 5. Mild diverticulosis along the sigmoid colon, without evidence of diverticulitis. 6. Chronic compression deformities involving vertebral bodies T12 and L4, without definite acute fracture. Electronically Signed   By: Roanna RaiderJeffery  Chang M.D.   On: 08/04/2015 06:39   Ct Head Wo Contrast  08/04/2015  CLINICAL DATA:  Status post fall, with hypoxia. Concern for head injury. Initial encounter. EXAM: CT HEAD WITHOUT CONTRAST TECHNIQUE: Contiguous axial images were obtained from the base of the skull through the vertex without intravenous contrast. COMPARISON:  CT of the head performed 12/26/2013 FINDINGS: There is no evidence of acute infarction, mass lesion, or intra- or extra-axial hemorrhage on CT. Prominence of the ventricles and sulci reflects mild to moderate cortical volume loss. Mild cerebellar atrophy is noted. Scattered periventricular and subcortical white matter change likely reflects small vessel ischemic microangiopathy. The brainstem and fourth ventricle are within normal limits. The basal ganglia are unremarkable in appearance. The cerebral hemispheres demonstrate grossly normal  gray-white differentiation. No mass effect or midline shift is seen. There is no evidence of fracture; visualized osseous structures are unremarkable in appearance. The orbits are within normal limits. The paranasal sinuses and mastoid air cells are well-aerated. No significant soft tissue abnormalities are seen. IMPRESSION: 1. No evidence of traumatic intracranial injury or fracture. 2. Mild to moderate cortical volume loss and scattered  small vessel ischemic microangiopathy. Electronically Signed   By: Roanna Raider M.D.   On: 08/04/2015 06:33   Ct Chest Wo Contrast  08/04/2015  CLINICAL DATA:  Status post fall at living center. Concern for chest or abdominal injury. Initial encounter. EXAM: CT CHEST, ABDOMEN AND PELVIS WITHOUT CONTRAST TECHNIQUE: Multidetector CT imaging of the chest, abdomen and pelvis was performed following the standard protocol without IV contrast. COMPARISON:  Chest radiograph performed 07/13/2015 FINDINGS: CT CHEST FINDINGS Small bilateral pleural effusions are noted, with diffuse patchy bilateral airspace opacification. This may reflect pulmonary edema or atypical infection. No pneumothorax is seen. Scattered coronary artery calcifications are seen. Scattered calcification is noted along the thoracic aorta. The mediastinum is otherwise unremarkable. No mediastinal lymphadenopathy is appreciated. No pericardial effusion is identified. The great vessels are grossly unremarkable. A left-sided chest port is noted with its leads ending at the right atrium and right ventricle. The thyroid gland is unremarkable in appearance. No axillary lymphadenopathy is seen. No acute osseous abnormalities are identified. CT ABDOMEN AND PELVIS FINDINGS The liver and spleen are unremarkable in appearance. The patient is status post cholecystectomy, with clips noted along the gallbladder fossa. The pancreas and adrenal glands are unremarkable. The kidneys are unremarkable in appearance. There is no evidence  of hydronephrosis. No renal or ureteral stones are seen. No perinephric stranding is appreciated. No free fluid is identified. The small bowel is unremarkable in appearance. The stomach is within normal limits. No acute vascular abnormalities are seen. Scattered calcification is noted along the abdominal aorta and its branches. The appendix is normal in caliber and contains air, without evidence of appendicitis. Mild diverticulosis is noted along the sigmoid colon, without evidence of diverticulitis. The bladder is decompressed, with a Foley catheter in place. The patient is status post hysterectomy. No suspicious adnexal masses are seen. No inguinal lymphadenopathy is seen. No acute osseous abnormalities are identified. There are chronic compression deformities involving vertebral bodies T12 and L4, without definite acute fracture. IMPRESSION: 1. No evidence of traumatic injury to the chest, abdomen or pelvis. 2. Small bilateral pleural effusions, with diffuse patchy bilateral airspace opacification. This may reflect pulmonary edema or atypical infection. 3. Scattered coronary artery calcifications seen. 4. Scattered calcification along the abdominal aorta and its branches. 5. Mild diverticulosis along the sigmoid colon, without evidence of diverticulitis. 6. Chronic compression deformities involving vertebral bodies T12 and L4, without definite acute fracture. Electronically Signed   By: Roanna Raider M.D.   On: 08/04/2015 06:39   Dg Chest Port 1 View  07/12/2015  CLINICAL DATA:  Shortness of breath.  Sore throat.  Hypoxia. EXAM: PORTABLE CHEST 1 VIEW COMPARISON:  12/29/2013 FINDINGS: New prominently reverse lordotic projection likely due to kyphosis. Dual lead pacer in place, although distal lead tip is below the inferior margin of today's image. Moderate enlargement of the cardiopericardial silhouette. Atherosclerotic aortic arch. Patchy bilateral airspace opacities especially in the perihilar and basilar  regions. Indistinct obscuration of the hemidiaphragms, right greater the left. IMPRESSION: 1. Indistinct vasculature with bilateral airspace opacities and enlargement of the cardiopericardial silhouette, favoring acute pulmonary edema. 2. Atherosclerotic aortic arch. 3. Dual lead pacer in place. 4. Cannot exclude layering pleural effusions. Electronically Signed   By: Gaylyn Rong M.D.   On: 08/09/2015 16:50     CBC  Recent Labs Lab 07/22/2015 1547 07/13/2015 1812 08/04/15 1005 08/05/15 0215  WBC 20.7* 20.5* 24.1* 18.4*  HGB 10.2* 10.6* 10.8* 9.3*  HCT 29.6* 31.5* 31.8* 29.1*  PLT 140*  144* 176 171  MCV 91.6 91.3 92.2 94.2  MCH 31.6 30.7 31.3 30.1  MCHC 34.5 33.7 34.0 32.0  RDW 14.3 14.2 14.4 14.6  LYMPHSABS 0.5*  --   --   --   MONOABS 2.6*  --   --   --   EOSABS 0.0  --   --   --   BASOSABS 0.0  --   --   --     Chemistries   Recent Labs Lab 07/12/2015 1547 07/28/2015 1812 07/20/2015 1817 08/04/15 1005 08/05/15 0215  NA 125*  --   --  132* 134*  K 4.4  --   --  4.3 4.1  CL 96*  --   --  99* 104  CO2 19*  --   --  24 21*  GLUCOSE 203*  --   --  151* 171*  BUN 38*  --   --  32* 45*  CREATININE 1.56* 1.48*  --  1.29* 1.48*  CALCIUM 8.8*  --   --  9.0 8.7*  MG  --   --  1.6*  --   --   AST  --   --   --  35 34  ALT  --   --   --  23 24  ALKPHOS  --   --   --  41 38  BILITOT  --   --   --  0.8 1.0   ------------------------------------------------------------------------------------------------------------------ estimated creatinine clearance is 22.6 mL/min (by C-G formula based on Cr of 1.48). ------------------------------------------------------------------------------------------------------------------ No results for input(s): HGBA1C in the last 72 hours. ------------------------------------------------------------------------------------------------------------------ No results for input(s): CHOL, HDL, LDLCALC, TRIG, CHOLHDL, LDLDIRECT in the last 72  hours. ------------------------------------------------------------------------------------------------------------------  Recent Labs  07/21/2015 1811  TSH 0.331*   ------------------------------------------------------------------------------------------------------------------ No results for input(s): VITAMINB12, FOLATE, FERRITIN, TIBC, IRON, RETICCTPCT in the last 72 hours.  Coagulation profile  Recent Labs Lab 07/13/2015 1817  INR 2.24*    No results for input(s): DDIMER in the last 72 hours.  Cardiac Enzymes  Recent Labs Lab 08/02/2015 1817 08/10/2015 2319 08/04/15 0525  TROPONINI 0.54* 0.38* 0.31*   ------------------------------------------------------------------------------------------------------------------ Invalid input(s): POCBNP   CBG:  Recent Labs Lab 08/04/15 1604 08/04/15 1943 08/04/15 2341 08/05/15 0424 08/05/15 0802  GLUCAP 150* 150* 134* 155* 138*       Studies: Ct Abdomen Pelvis Wo Contrast  08/04/2015  CLINICAL DATA:  Status post fall at living center. Concern for chest or abdominal injury. Initial encounter. EXAM: CT CHEST, ABDOMEN AND PELVIS WITHOUT CONTRAST TECHNIQUE: Multidetector CT imaging of the chest, abdomen and pelvis was performed following the standard protocol without IV contrast. COMPARISON:  Chest radiograph performed 07/28/2015 FINDINGS: CT CHEST FINDINGS Small bilateral pleural effusions are noted, with diffuse patchy bilateral airspace opacification. This may reflect pulmonary edema or atypical infection. No pneumothorax is seen. Scattered coronary artery calcifications are seen. Scattered calcification is noted along the thoracic aorta. The mediastinum is otherwise unremarkable. No mediastinal lymphadenopathy is appreciated. No pericardial effusion is identified. The great vessels are grossly unremarkable. A left-sided chest port is noted with its leads ending at the right atrium and right ventricle. The thyroid gland is  unremarkable in appearance. No axillary lymphadenopathy is seen. No acute osseous abnormalities are identified. CT ABDOMEN AND PELVIS FINDINGS The liver and spleen are unremarkable in appearance. The patient is status post cholecystectomy, with clips noted along the gallbladder fossa. The pancreas and adrenal glands are unremarkable. The kidneys are unremarkable in appearance. There is no evidence  of hydronephrosis. No renal or ureteral stones are seen. No perinephric stranding is appreciated. No free fluid is identified. The small bowel is unremarkable in appearance. The stomach is within normal limits. No acute vascular abnormalities are seen. Scattered calcification is noted along the abdominal aorta and its branches. The appendix is normal in caliber and contains air, without evidence of appendicitis. Mild diverticulosis is noted along the sigmoid colon, without evidence of diverticulitis. The bladder is decompressed, with a Foley catheter in place. The patient is status post hysterectomy. No suspicious adnexal masses are seen. No inguinal lymphadenopathy is seen. No acute osseous abnormalities are identified. There are chronic compression deformities involving vertebral bodies T12 and L4, without definite acute fracture. IMPRESSION: 1. No evidence of traumatic injury to the chest, abdomen or pelvis. 2. Small bilateral pleural effusions, with diffuse patchy bilateral airspace opacification. This may reflect pulmonary edema or atypical infection. 3. Scattered coronary artery calcifications seen. 4. Scattered calcification along the abdominal aorta and its branches. 5. Mild diverticulosis along the sigmoid colon, without evidence of diverticulitis. 6. Chronic compression deformities involving vertebral bodies T12 and L4, without definite acute fracture. Electronically Signed   By: Roanna Raider M.D.   On: 08/04/2015 06:39   Ct Head Wo Contrast  08/04/2015  CLINICAL DATA:  Status post fall, with hypoxia.  Concern for head injury. Initial encounter. EXAM: CT HEAD WITHOUT CONTRAST TECHNIQUE: Contiguous axial images were obtained from the base of the skull through the vertex without intravenous contrast. COMPARISON:  CT of the head performed 12/26/2013 FINDINGS: There is no evidence of acute infarction, mass lesion, or intra- or extra-axial hemorrhage on CT. Prominence of the ventricles and sulci reflects mild to moderate cortical volume loss. Mild cerebellar atrophy is noted. Scattered periventricular and subcortical white matter change likely reflects small vessel ischemic microangiopathy. The brainstem and fourth ventricle are within normal limits. The basal ganglia are unremarkable in appearance. The cerebral hemispheres demonstrate grossly normal gray-white differentiation. No mass effect or midline shift is seen. There is no evidence of fracture; visualized osseous structures are unremarkable in appearance. The orbits are within normal limits. The paranasal sinuses and mastoid air cells are well-aerated. No significant soft tissue abnormalities are seen. IMPRESSION: 1. No evidence of traumatic intracranial injury or fracture. 2. Mild to moderate cortical volume loss and scattered small vessel ischemic microangiopathy. Electronically Signed   By: Roanna Raider M.D.   On: 08/04/2015 06:33   Ct Chest Wo Contrast  08/04/2015  CLINICAL DATA:  Status post fall at living center. Concern for chest or abdominal injury. Initial encounter. EXAM: CT CHEST, ABDOMEN AND PELVIS WITHOUT CONTRAST TECHNIQUE: Multidetector CT imaging of the chest, abdomen and pelvis was performed following the standard protocol without IV contrast. COMPARISON:  Chest radiograph performed 08/01/2015 FINDINGS: CT CHEST FINDINGS Small bilateral pleural effusions are noted, with diffuse patchy bilateral airspace opacification. This may reflect pulmonary edema or atypical infection. No pneumothorax is seen. Scattered coronary artery calcifications  are seen. Scattered calcification is noted along the thoracic aorta. The mediastinum is otherwise unremarkable. No mediastinal lymphadenopathy is appreciated. No pericardial effusion is identified. The great vessels are grossly unremarkable. A left-sided chest port is noted with its leads ending at the right atrium and right ventricle. The thyroid gland is unremarkable in appearance. No axillary lymphadenopathy is seen. No acute osseous abnormalities are identified. CT ABDOMEN AND PELVIS FINDINGS The liver and spleen are unremarkable in appearance. The patient is status post cholecystectomy, with clips noted along the gallbladder fossa.  The pancreas and adrenal glands are unremarkable. The kidneys are unremarkable in appearance. There is no evidence of hydronephrosis. No renal or ureteral stones are seen. No perinephric stranding is appreciated. No free fluid is identified. The small bowel is unremarkable in appearance. The stomach is within normal limits. No acute vascular abnormalities are seen. Scattered calcification is noted along the abdominal aorta and its branches. The appendix is normal in caliber and contains air, without evidence of appendicitis. Mild diverticulosis is noted along the sigmoid colon, without evidence of diverticulitis. The bladder is decompressed, with a Foley catheter in place. The patient is status post hysterectomy. No suspicious adnexal masses are seen. No inguinal lymphadenopathy is seen. No acute osseous abnormalities are identified. There are chronic compression deformities involving vertebral bodies T12 and L4, without definite acute fracture. IMPRESSION: 1. No evidence of traumatic injury to the chest, abdomen or pelvis. 2. Small bilateral pleural effusions, with diffuse patchy bilateral airspace opacification. This may reflect pulmonary edema or atypical infection. 3. Scattered coronary artery calcifications seen. 4. Scattered calcification along the abdominal aorta and its  branches. 5. Mild diverticulosis along the sigmoid colon, without evidence of diverticulitis. 6. Chronic compression deformities involving vertebral bodies T12 and L4, without definite acute fracture. Electronically Signed   By: Roanna Raider M.D.   On: 08/04/2015 06:39   Dg Chest Port 1 View  08/08/2015  CLINICAL DATA:  Shortness of breath.  Sore throat.  Hypoxia. EXAM: PORTABLE CHEST 1 VIEW COMPARISON:  12/29/2013 FINDINGS: New prominently reverse lordotic projection likely due to kyphosis. Dual lead pacer in place, although distal lead tip is below the inferior margin of today's image. Moderate enlargement of the cardiopericardial silhouette. Atherosclerotic aortic arch. Patchy bilateral airspace opacities especially in the perihilar and basilar regions. Indistinct obscuration of the hemidiaphragms, right greater the left. IMPRESSION: 1. Indistinct vasculature with bilateral airspace opacities and enlargement of the cardiopericardial silhouette, favoring acute pulmonary edema. 2. Atherosclerotic aortic arch. 3. Dual lead pacer in place. 4. Cannot exclude layering pleural effusions. Electronically Signed   By: Gaylyn Rong M.D.   On: 08/05/2015 16:50      No results found for: HGBA1C Lab Results  Component Value Date   CREATININE 1.48* 08/05/2015       Scheduled Meds: . amiodarone  200 mg Oral Daily  . antiseptic oral rinse  7 mL Mouth Rinse q12n4p  .  ceFAZolin (ANCEF) IV  1 g Intravenous Q12H  . chlorhexidine  15 mL Mouth Rinse BID  . Chlorhexidine Gluconate Cloth  6 each Topical Q0600  . escitalopram  10 mg Oral Daily  . fluticasone  1 spray Each Nare Daily  . heparin subcutaneous  5,000 Units Subcutaneous 3 times per day  . latanoprost  1 drop Both Eyes QHS  . loratadine  10 mg Oral Daily  . mupirocin ointment  1 application Nasal BID  . pantoprazole (PROTONIX) IV  40 mg Intravenous Q12H  . sodium chloride  1 spray Each Nare QID  . sodium chloride  3 mL Intravenous Q12H   . sodium chloride  3 mL Intravenous Q12H  . vancomycin  500 mg Intravenous Q24H   Continuous Infusions: . sodium chloride 10 mL/hr at 08/04/15 2026    Principal Problem:   CHF (congestive heart failure) (HCC) Active Problems:   Complete heart block (HCC)   Hypertension   Atrial fibrillation (HCC)   Sepsis (HCC)   AKI (acute kidney injury) (HCC)    Time spent: 45 minutes   Alex Leahy  [facility] Pager [phone]. If 7PM-7AM, please contact night-coverage at [email or web address], password [facility] 08/05/2015, 11:40 AM  LOS: 2 days

## 2015-08-05 NOTE — Consult Note (Signed)
[facility]  Total days of antibiotics 3        Day 3 vanco/ piptazp               Reason for Consult: staph aureus bacteremia in setting of pacemaker    Referring Physician: abrol  Principal Problem:   CHF (congestive heart failure) ([place]) Active Problems:   Complete heart block (HCC)   Hypertension   Atrial fibrillation (HCC)   Sepsis ([place])   AKI (acute kidney injury) ([place])    HPI: Julie Serrano is a 81 y.o. female * with a history of paroxysmal atrial fibrillation, CHF, pacemaker, hypertension, dyslipidemia,SNF resident who started to feel poorly 3 days prior to admission and sustained fall with head injury. Since then progressively worsening AMS. Admitted for evaluation noted to be febrile to 104, hypoxic, oxygen saturation of 82% on room air, initial blood pressure 1 19 /74. Heart rate 106, 84% on 5 L. Patient subsequently placed on a nonrebreather. Fund of a sodium of 125, acute kidney injury with a creatinine of 1.56, up from BL of 1.0, [id number]. Troponin 0.55. Lactic acid 3.58. White blood cell count of 20.7. Patient is being admitted for sepsis to step down. Chest x-ray shows indistinct opacity, pneumonia versus CHF. Blood cx growing Staph aureus. Patient on HD #3 still has AMS, with her daughter at her bedside mentioning that her mother is DNR, and would not want aggressive care. She would like chaplain services.   Past Medical History  Diagnosis Date  . Anxiety and depression   . Arthritis   . Atrial fibrillation (HCC)     a. amiodarone  . Hypertension   . Hyperlipemia   . Diverticulitis   . Esophageal stricture   . Complete heart block [facility])     s/p dual chamber MDT PPM implantation April 2015    Allergies:  Allergies  Allergen Reactions  . Ativan [Lorazepam] Other (See Comments)    Extreme delirium  . Sulfa Antibiotics     unknown    MEDICATIONS: . amiodarone  200 mg Oral Daily  . antiseptic oral rinse  7 mL Mouth Rinse q12n4p  .  chlorhexidine  15 mL Mouth Rinse BID  . Chlorhexidine Gluconate Cloth  6 each Topical Q0600  . escitalopram  10 mg Oral Daily  . fluticasone  1 spray Each Nare Daily  . heparin subcutaneous  5,000 Units Subcutaneous 3 times per day  . latanoprost  1 drop Both Eyes QHS  . loratadine  10 mg Oral Daily  . mupirocin ointment  1 application Nasal BID  . pantoprazole (PROTONIX) IV  40 mg Intravenous Q12H  . piperacillin-tazobactam (ZOSYN)  IV  3.375 g Intravenous Q8H  . sodium chloride  1 spray Each Nare QID  . sodium chloride  3 mL Intravenous Q12H  . sodium chloride  3 mL Intravenous Q12H  . vancomycin  500 mg Intravenous Q24H    Social History  Substance Use Topics  . Smoking status: Never Smoker   . Smokeless tobacco: Never Used  . Alcohol Use: Yes  - SNF resident  History reviewed. No pertinent family history.  Review of Systems - Unable to obtain due to AMS  OBJECTIVE: Temp:  [97.8 F (36.6 C)-98.4 F (36.9 C)] 98.2 F (36.8 C) (11/25 0804) Pulse Rate:  [27-117] 95 (11/25 0942) Resp:  [15-37] 15 (11/25 0942) BP: (88-138)/(47-90) 102/59 mmHg (11/25 0804) SpO2:  [75 %-100 %] 98 % (11/25 0942) FiO2 (%):  [  50 %-65 %] 50 % (11/25 0804) Physical Exam  Constitutional:  oriented to x 0. Agitated in bed HENT: [place]/AT, PERRLA, no scleral icterus Mouth/Throat: Oropharynx is dry. Wearing non rebreather Cardiovascular: distant, Normal rate, regular rhythm and normal heart sounds. Exam reveals no gallop and no friction rub.  No murmur heard.  Pulmonary/Chest: Effort normal and breath sounds normal. No respiratory distress.  has no wheezes.  Neck = supple, no nuchal rigidity Abdominal: Soft. Bowel sounds are decreased.  exhibits no distension. There is no tenderness.  Lymphadenopathy: no cervical adenopathy. No axillary adenopathy Neurological: moves all extremities, non purposefully Skin: Scattered echymosis   LABS: Results for orders placed or performed during the hospital  encounter of 07/22/2015 (from the past 48 hour(s))  Basic metabolic panel     Status: Abnormal   Collection Time: 07/23/2015  3:47 PM  Result Value Ref Range   Sodium 125 (L) 135 - 145 mmol/L   Potassium 4.4 3.5 - 5.1 mmol/L   Chloride 96 (L) 101 - 111 mmol/L   CO2 19 (L) 22 - 32 mmol/L   Glucose, Bld 203 (H) 65 - 99 mg/dL   BUN 38 (H) 6 - 20 mg/dL   Creatinine, Ser 1.56 (H) 0.44 - 1.00 mg/dL   Calcium 8.8 (L) 8.9 - 10.3 mg/dL   GFR calc non Af Amer 29 (L) >60 mL/min   GFR calc Af Amer 33 (L) >60 mL/min    Comment: (NOTE) The eGFR has been calculated using the CKD EPI equation. This calculation has not been validated in all clinical situations. eGFR's persistently <60 mL/min signify possible Chronic Kidney Disease.    Anion gap 10 5 - 15  CBC with Differential     Status: Abnormal   Collection Time: 07/13/2015  3:47 PM  Result Value Ref Range   WBC 20.7 (H) 4.0 - 10.5 K/uL   RBC 3.23 (L) 3.87 - 5.11 MIL/uL   Hemoglobin 10.2 (L) 12.0 - 15.0 g/dL   HCT 29.6 (L) 36.0 - 46.0 %   MCV 91.6 78.0 - 100.0 fL   MCH 31.6 26.0 - 34.0 pg   MCHC 34.5 30.0 - 36.0 g/dL   RDW 14.3 11.5 - 15.5 %   Platelets 140 (L) 150 - 400 K/uL   Neutrophils Relative % 86 %   Neutro Abs 17.6 (H) 1.7 - 7.7 K/uL   Lymphocytes Relative 2 %   Lymphs Abs 0.5 (L) 0.7 - 4.0 K/uL   Monocytes Relative 12 %   Monocytes Absolute 2.6 (H) 0.1 - 1.0 K/uL   Eosinophils Relative 0 %   Eosinophils Absolute 0.0 0.0 - 0.7 K/uL   Basophils Relative 0 %   Basophils Absolute 0.0 0.0 - 0.1 K/uL  Culture, blood (routine x 2)     Status: None (Preliminary result)   Collection Time: 07/19/2015  3:47 PM  Result Value Ref Range   Specimen Description BLOOD RIGHT FOREARM    Special Requests BOTTLES DRAWN AEROBIC AND ANAEROBIC 5CC    Culture  Setup Time      GRAM POSITIVE COCCI IN CLUSTERS IN BOTH AEROBIC AND ANAEROBIC BOTTLES CRITICAL RESULT CALLED TO, READ BACK BY AND VERIFIED WITH: T AYERS@[id number]  08/04/15 MKELLY    Culture  STAPHYLOCOCCUS AUREUS    Report Status PENDING   Brain natriuretic peptide     Status: Abnormal   Collection Time: 08/02/2015  3:47 PM  Result Value Ref Range   B Natriuretic Peptide 3242.4 (H) 0.0 - 100.0 pg/mL  Culture, blood (  routine x 2)     Status: None (Preliminary result)   Collection Time: 07/24/2015  3:59 PM  Result Value Ref Range   Specimen Description BLOOD LEFT ANTECUBITAL    Special Requests BOTTLES DRAWN AEROBIC AND ANAEROBIC 5CC    Culture  Setup Time      GRAM POSITIVE COCCI IN CLUSTERS IN BOTH AEROBIC AND ANAEROBIC BOTTLES CRITICAL RESULT CALLED TO, READ BACK BY AND VERIFIED WITH: T OLEARY 08/04/15 @ 0814 M VESTAL    Culture STAPHYLOCOCCUS AUREUS    Report Status PENDING   I-stat troponin, ED     Status: Abnormal   Collection Time: 07/25/2015  4:01 PM  Result Value Ref Range   Troponin i, poc 0.55 (HH) 0.00 - 0.08 ng/mL   Comment NOTIFIED PHYSICIAN    Comment 3            Comment: Due to the release kinetics of cTnI, a negative result within the first hours of the onset of symptoms does not rule out myocardial infarction with certainty. If myocardial infarction is still suspected, repeat the test at appropriate intervals.   I-Stat CG4 Lactic Acid, ED     Status: Abnormal   Collection Time: 08/02/2015  4:03 PM  Result Value Ref Range   Lactic Acid, Venous 3.58 (HH) 0.5 - 2.0 mmol/L   Comment NOTIFIED PHYSICIAN   Urine culture     Status: None   Collection Time: 07/19/2015  5:11 PM  Result Value Ref Range   Specimen Description URINE, CATHETERIZED    Special Requests Normal    Culture NO GROWTH 2 DAYS    Report Status 08/05/2015 FINAL   Urinalysis, Routine w reflex microscopic     Status: Abnormal   Collection Time: 07/19/2015  5:11 PM  Result Value Ref Range   Color, Urine YELLOW YELLOW   APPearance CLOUDY (A) CLEAR   Specific Gravity, Urine 1.025 1.005 - 1.030   pH 5.0 5.0 - 8.0   Glucose, UA 100 (A) NEGATIVE mg/dL   Hgb urine dipstick MODERATE (A) NEGATIVE    Bilirubin Urine NEGATIVE NEGATIVE   Ketones, ur NEGATIVE NEGATIVE mg/dL   Protein, ur 30 (A) NEGATIVE mg/dL   Nitrite NEGATIVE NEGATIVE   Leukocytes, UA NEGATIVE NEGATIVE  Urine microscopic-add on     Status: Abnormal   Collection Time: 07/12/2015  5:11 PM  Result Value Ref Range   Squamous Epithelial / LPF 0-5 (A) NONE SEEN    Comment: Please note change in reference range.   WBC, UA 0-5 0 - 5 WBC/hpf    Comment: Please note change in reference range.   RBC / HPF 0-5 0 - 5 RBC/hpf    Comment: Please note change in reference range.   Bacteria, UA MANY (A) NONE SEEN    Comment: Please note change in reference range.  Lactic acid, plasma     Status: Abnormal   Collection Time: 07/18/2015  6:08 PM  Result Value Ref Range   Lactic Acid, Venous 2.7 (HH) 0.5 - 2.0 mmol/L    Comment: CRITICAL RESULT CALLED TO, READ BACK BY AND VERIFIED WITH: C SCHENCK,RN 1854 07/15/2015 D BRADLEY   TSH     Status: Abnormal   Collection Time: 07/21/2015  6:11 PM  Result Value Ref Range   TSH 0.331 (L) 0.350 - 4.500 uIU/mL  CBC     Status: Abnormal   Collection Time: 07/31/2015  6:12 PM  Result Value Ref Range   WBC 20.5 (H) 4.0 - 10.5 K/uL  RBC 3.45 (L) 3.87 - 5.11 MIL/uL   Hemoglobin 10.6 (L) 12.0 - 15.0 g/dL   HCT 31.5 (L) 36.0 - 46.0 %   MCV 91.3 78.0 - 100.0 fL   MCH 30.7 26.0 - 34.0 pg   MCHC 33.7 30.0 - 36.0 g/dL   RDW 14.2 11.5 - 15.5 %   Platelets 144 (L) 150 - 400 K/uL  Creatinine, serum     Status: Abnormal   Collection Time: 07/18/2015  6:12 PM  Result Value Ref Range   Creatinine, Ser 1.48 (H) 0.44 - 1.00 mg/dL   GFR calc non Af Amer 30 (L) >60 mL/min   GFR calc Af Amer 35 (L) >60 mL/min    Comment: (NOTE) The eGFR has been calculated using the CKD EPI equation. This calculation has not been validated in all clinical situations. eGFR's persistently <60 mL/min signify possible Chronic Kidney Disease.   Magnesium     Status: Abnormal   Collection Time: 07/14/2015  6:17 PM  Result Value Ref  Range   Magnesium 1.6 (L) 1.7 - 2.4 mg/dL  Procalcitonin     Status: None   Collection Time: 07/29/2015  6:17 PM  Result Value Ref Range   Procalcitonin 0.25 ng/mL    Comment:        Interpretation: PCT (Procalcitonin) <= 0.5 ng/mL: Systemic infection (sepsis) is not likely. Local bacterial infection is possible. (NOTE)         ICU PCT Algorithm               Non ICU PCT Algorithm    ----------------------------     ------------------------------         PCT < 0.25 ng/mL                 PCT < 0.1 ng/mL     Stopping of antibiotics            Stopping of antibiotics       strongly encouraged.               strongly encouraged.    ----------------------------     ------------------------------       PCT level decrease by               PCT < 0.25 ng/mL       >= 80% from peak PCT       OR PCT 0.25 - 0.5 ng/mL          Stopping of antibiotics                                             encouraged.     Stopping of antibiotics           encouraged.    ----------------------------     ------------------------------       PCT level decrease by              PCT >= 0.25 ng/mL       < 80% from peak PCT        AND PCT >= 0.5 ng/mL            Continuin g antibiotics  encouraged.       Continuing antibiotics            encouraged.    ----------------------------     ------------------------------     PCT level increase compared          PCT > 0.5 ng/mL         with peak PCT AND          PCT >= 0.5 ng/mL             Escalation of antibiotics                                          strongly encouraged.      Escalation of antibiotics        strongly encouraged.   Protime-INR     Status: Abnormal   Collection Time: 07/12/2015  6:17 PM  Result Value Ref Range   Prothrombin Time 24.6 (H) 11.6 - 15.2 seconds   INR 2.24 (H) 0.00 - 1.49  Phosphorus     Status: Abnormal   Collection Time: 07/25/2015  6:17 PM  Result Value Ref Range   Phosphorus 2.3 (L) 2.5  - 4.6 mg/dL  Troponin I     Status: Abnormal   Collection Time: 07/16/2015  6:17 PM  Result Value Ref Range   Troponin I 0.54 (HH) <0.031 ng/mL    Comment:        POSSIBLE MYOCARDIAL ISCHEMIA. SERIAL TESTING RECOMMENDED. CRITICAL RESULT CALLED TO, READ BACK BY AND VERIFIED WITH: C SCHENCK,RN [id number] 08/02/2015 D BRADLEY   MRSA PCR Screening     Status: Abnormal   Collection Time: 08/01/2015  6:43 PM  Result Value Ref Range   MRSA by PCR POSITIVE (A) NEGATIVE    Comment:        The GeneXpert MRSA Assay (FDA approved for NASAL specimens only), is one component of a comprehensive MRSA colonization surveillance program. It is not intended to diagnose MRSA infection nor to guide or monitor treatment for MRSA infections. RESULT CALLED TO, READ BACK BY AND VERIFIED WITH: Nolon Stalls RN 2025 08/10/2015 A BROWNING   I-STAT 3, arterial blood gas (G3+)     Status: Abnormal   Collection Time: 07/25/2015  7:42 PM  Result Value Ref Range   pH, Arterial 7.461 (H) 7.350 - 7.450   pCO2 arterial 29.4 (L) 35.0 - 45.0 mmHg   pO2, Arterial 83.0 80.0 - 100.0 mmHg   Bicarbonate 21.0 20.0 - 24.0 mEq/L   TCO2 22 0 - 100 mmol/L   O2 Saturation 97.0 %   Acid-base deficit 2.0 0.0 - 2.0 mmol/L   Patient temperature 98.6 F    Collection site RADIAL, ALLEN'S TEST ACCEPTABLE    Drawn by RT    Sample type ARTERIAL   Lactic acid, plasma     Status: Abnormal   Collection Time: 08/02/2015  8:17 PM  Result Value Ref Range   Lactic Acid, Venous 2.6 (HH) 0.5 - 2.0 mmol/L    Comment: CRITICAL RESULT CALLED TO, READ BACK BY AND VERIFIED WITH: AYERS [facility] 07/25/2015 2118 WAYK   Glucose, capillary     Status: Abnormal   Collection Time: 07/29/2015  9:27 PM  Result Value Ref Range   Glucose-Capillary 157 (H) 65 - 99 mg/dL   Comment 1 Capillary Specimen   Influenza panel by PCR (type A & B, H1N1)  Status: None   Collection Time: 07/24/2015 10:23 PM  Result Value Ref Range   Influenza A By PCR NEGATIVE NEGATIVE   Influenza B  By PCR NEGATIVE NEGATIVE   H1N1 flu by pcr NOT DETECTED NOT DETECTED    Comment:        The Xpert Flu assay (FDA approved for nasal aspirates or washes and nasopharyngeal swab specimens), is intended as an aid in the diagnosis of influenza and should not be used as a sole basis for treatment.   Troponin I     Status: Abnormal   Collection Time: 08/01/2015 11:19 PM  Result Value Ref Range   Troponin I 0.38 (H) <0.031 ng/mL    Comment:        PERSISTENTLY INCREASED TROPONIN VALUES IN THE RANGE OF 0.04-0.49 ng/mL CAN BE SEEN IN:       -UNSTABLE ANGINA       -CONGESTIVE HEART FAILURE       -MYOCARDITIS       -CHEST TRAUMA       -ARRYHTHMIAS       -LATE PRESENTING MYOCARDIAL INFARCTION       -COPD   CLINICAL FOLLOW-UP RECOMMENDED.   Glucose, capillary     Status: Abnormal   Collection Time: 07/23/2015 11:51 PM  Result Value Ref Range   Glucose-Capillary 182 (H) 65 - 99 mg/dL   Comment 1 Capillary Specimen   Glucose, capillary     Status: Abnormal   Collection Time: 08/04/15  4:20 AM  Result Value Ref Range   Glucose-Capillary 211 (H) 65 - 99 mg/dL   Comment 1 Capillary Specimen   Troponin I     Status: Abnormal   Collection Time: 08/04/15  5:25 AM  Result Value Ref Range   Troponin I 0.31 (H) <0.031 ng/mL    Comment:        PERSISTENTLY INCREASED TROPONIN VALUES IN THE RANGE OF 0.04-0.49 ng/mL CAN BE SEEN IN:       -UNSTABLE ANGINA       -CONGESTIVE HEART FAILURE       -MYOCARDITIS       -CHEST TRAUMA       -ARRYHTHMIAS       -LATE PRESENTING MYOCARDIAL INFARCTION       -COPD   CLINICAL FOLLOW-UP RECOMMENDED.   Glucose, capillary     Status: Abnormal   Collection Time: 08/04/15  8:59 AM  Result Value Ref Range   Glucose-Capillary 140 (H) 65 - 99 mg/dL  CBC     Status: Abnormal   Collection Time: 08/04/15 10:05 AM  Result Value Ref Range   WBC 24.1 (H) 4.0 - 10.5 K/uL   RBC 3.45 (L) 3.87 - 5.11 MIL/uL   Hemoglobin 10.8 (L) 12.0 - 15.0 g/dL   HCT 31.8 (L) 36.0 -  46.0 %   MCV 92.2 78.0 - 100.0 fL   MCH 31.3 26.0 - 34.0 pg   MCHC 34.0 30.0 - 36.0 g/dL   RDW 14.4 11.5 - 15.5 %   Platelets 176 150 - 400 K/uL  Comprehensive metabolic panel     Status: Abnormal   Collection Time: 08/04/15 10:05 AM  Result Value Ref Range   Sodium 132 (L) 135 - 145 mmol/L   Potassium 4.3 3.5 - 5.1 mmol/L   Chloride 99 (L) 101 - 111 mmol/L   CO2 24 22 - 32 mmol/L   Glucose, Bld 151 (H) 65 - 99 mg/dL   BUN 32 (H) 6 - 20 mg/dL  Creatinine, Ser 1.29 (H) 0.44 - 1.00 mg/dL   Calcium 9.0 8.9 - 10.3 mg/dL   Total Protein 6.0 (L) 6.5 - 8.1 g/dL   Albumin 2.8 (L) 3.5 - 5.0 g/dL   AST 35 15 - 41 U/L   ALT 23 14 - 54 U/L   Alkaline Phosphatase 41 38 - 126 U/L   Total Bilirubin 0.8 0.3 - 1.2 mg/dL   GFR calc non Af Amer 36 (L) >60 mL/min   GFR calc Af Amer 42 (L) >60 mL/min    Comment: (NOTE) The eGFR has been calculated using the CKD EPI equation. This calculation has not been validated in all clinical situations. eGFR's persistently <60 mL/min signify possible Chronic Kidney Disease.    Anion gap 9 5 - 15  Glucose, capillary     Status: Abnormal   Collection Time: 08/04/15 10:59 AM  Result Value Ref Range   Glucose-Capillary 172 (H) 65 - 99 mg/dL  I-STAT 3, arterial blood gas (G3+)     Status: Abnormal   Collection Time: 08/04/15  3:51 PM  Result Value Ref Range   pH, Arterial 7.321 (L) 7.350 - 7.450   pCO2 arterial 39.7 35.0 - 45.0 mmHg   pO2, Arterial 76.0 (L) 80.0 - 100.0 mmHg   Bicarbonate 20.5 20.0 - 24.0 mEq/L   TCO2 22 0 - 100 mmol/L   O2 Saturation 94.0 %   Acid-base deficit 5.0 (H) 0.0 - 2.0 mmol/L   Patient temperature 98.6 F    Collection site RADIAL, ALLEN'S TEST ACCEPTABLE    Drawn by Operator    Sample type ARTERIAL   Glucose, capillary     Status: Abnormal   Collection Time: 08/04/15  4:04 PM  Result Value Ref Range   Glucose-Capillary 150 (H) 65 - 99 mg/dL   Comment 1 Capillary Specimen   Glucose, capillary     Status: Abnormal    Collection Time: 08/04/15  7:43 PM  Result Value Ref Range   Glucose-Capillary 150 (H) 65 - 99 mg/dL   Comment 1 Capillary Specimen   Glucose, capillary     Status: Abnormal   Collection Time: 08/04/15 11:41 PM  Result Value Ref Range   Glucose-Capillary 134 (H) 65 - 99 mg/dL   Comment 1 Capillary Specimen   CBC     Status: Abnormal   Collection Time: 08/05/15  2:15 AM  Result Value Ref Range   WBC 18.4 (H) 4.0 - 10.5 K/uL   RBC 3.09 (L) 3.87 - 5.11 MIL/uL   Hemoglobin 9.3 (L) 12.0 - 15.0 g/dL   HCT 29.1 (L) 36.0 - 46.0 %   MCV 94.2 78.0 - 100.0 fL   MCH 30.1 26.0 - 34.0 pg   MCHC 32.0 30.0 - 36.0 g/dL   RDW 14.6 11.5 - 15.5 %   Platelets 171 150 - 400 K/uL  Comprehensive metabolic panel     Status: Abnormal   Collection Time: 08/05/15  2:15 AM  Result Value Ref Range   Sodium 134 (L) 135 - 145 mmol/L   Potassium 4.1 3.5 - 5.1 mmol/L   Chloride 104 101 - 111 mmol/L   CO2 21 (L) 22 - 32 mmol/L   Glucose, Bld 171 (H) 65 - 99 mg/dL   BUN 45 (H) 6 - 20 mg/dL   Creatinine, Ser 1.48 (H) 0.44 - 1.00 mg/dL   Calcium 8.7 (L) 8.9 - 10.3 mg/dL   Total Protein 5.4 (L) 6.5 - 8.1 g/dL   Albumin 2.6 (L) 3.5 -  5.0 g/dL   AST 34 15 - 41 U/L   ALT 24 14 - 54 U/L   Alkaline Phosphatase 38 38 - 126 U/L   Total Bilirubin 1.0 0.3 - 1.2 mg/dL   GFR calc non Af Amer 30 (L) >60 mL/min   GFR calc Af Amer 35 (L) >60 mL/min    Comment: (NOTE) The eGFR has been calculated using the CKD EPI equation. This calculation has not been validated in all clinical situations. eGFR's persistently <60 mL/min signify possible Chronic Kidney Disease.    Anion gap 9 5 - 15  Glucose, capillary     Status: Abnormal   Collection Time: 08/05/15  4:24 AM  Result Value Ref Range   Glucose-Capillary 155 (H) 65 - 99 mg/dL   Comment 1 Capillary Specimen   Glucose, capillary     Status: Abnormal   Collection Time: 08/05/15  8:02 AM  Result Value Ref Range   Glucose-Capillary 138 (H) 65 - 99 mg/dL   Comment 1  Capillary Specimen     MICRO:  IMAGING: Ct Abdomen Pelvis Wo Contrast  08/04/2015  CLINICAL DATA:  Status post fall at living center. Concern for chest or abdominal injury. Initial encounter. EXAM: CT CHEST, ABDOMEN AND PELVIS WITHOUT CONTRAST TECHNIQUE: Multidetector CT imaging of the chest, abdomen and pelvis was performed following the standard protocol without IV contrast. COMPARISON:  Chest radiograph performed 08/04/2015 FINDINGS: CT CHEST FINDINGS Small bilateral pleural effusions are noted, with diffuse patchy bilateral airspace opacification. This may reflect pulmonary edema or atypical infection. No pneumothorax is seen. Scattered coronary artery calcifications are seen. Scattered calcification is noted along the thoracic aorta. The mediastinum is otherwise unremarkable. No mediastinal lymphadenopathy is appreciated. No pericardial effusion is identified. The great vessels are grossly unremarkable. A left-sided chest port is noted with its leads ending at the right atrium and right ventricle. The thyroid gland is unremarkable in appearance. No axillary lymphadenopathy is seen. No acute osseous abnormalities are identified. CT ABDOMEN AND PELVIS FINDINGS The liver and spleen are unremarkable in appearance. The patient is status post cholecystectomy, with clips noted along the gallbladder fossa. The pancreas and adrenal glands are unremarkable. The kidneys are unremarkable in appearance. There is no evidence of hydronephrosis. No renal or ureteral stones are seen. No perinephric stranding is appreciated. No free fluid is identified. The small bowel is unremarkable in appearance. The stomach is within normal limits. No acute vascular abnormalities are seen. Scattered calcification is noted along the abdominal aorta and its branches. The appendix is normal in caliber and contains air, without evidence of appendicitis. Mild diverticulosis is noted along the sigmoid colon, without evidence of  diverticulitis. The bladder is decompressed, with a Foley catheter in place. The patient is status post hysterectomy. No suspicious adnexal masses are seen. No inguinal lymphadenopathy is seen. No acute osseous abnormalities are identified. There are chronic compression deformities involving vertebral bodies T12 and L4, without definite acute fracture. IMPRESSION: 1. No evidence of traumatic injury to the chest, abdomen or pelvis. 2. Small bilateral pleural effusions, with diffuse patchy bilateral airspace opacification. This may reflect pulmonary edema or atypical infection. 3. Scattered coronary artery calcifications seen. 4. Scattered calcification along the abdominal aorta and its branches. 5. Mild diverticulosis along the sigmoid colon, without evidence of diverticulitis. 6. Chronic compression deformities involving vertebral bodies T12 and L4, without definite acute fracture. Electronically Signed   By: Garald Balding M.D.   On: 08/04/2015 06:39   Ct Head Wo Contrast  08/04/2015  CLINICAL DATA:  Status post fall, with hypoxia. Concern for head injury. Initial encounter. EXAM: CT HEAD WITHOUT CONTRAST TECHNIQUE: Contiguous axial images were obtained from the base of the skull through the vertex without intravenous contrast. COMPARISON:  CT of the head performed 12/26/2013 FINDINGS: There is no evidence of acute infarction, mass lesion, or intra- or extra-axial hemorrhage on CT. Prominence of the ventricles and sulci reflects mild to moderate cortical volume loss. Mild cerebellar atrophy is noted. Scattered periventricular and subcortical white matter change likely reflects small vessel ischemic microangiopathy. The brainstem and fourth ventricle are within normal limits. The basal ganglia are unremarkable in appearance. The cerebral hemispheres demonstrate grossly normal gray-white differentiation. No mass effect or midline shift is seen. There is no evidence of fracture; visualized osseous structures are  unremarkable in appearance. The orbits are within normal limits. The paranasal sinuses and mastoid air cells are well-aerated. No significant soft tissue abnormalities are seen. IMPRESSION: 1. No evidence of traumatic intracranial injury or fracture. 2. Mild to moderate cortical volume loss and scattered small vessel ischemic microangiopathy. Electronically Signed   By: Garald Balding M.D.   On: 08/04/2015 06:33   Ct Chest Wo Contrast  08/04/2015  CLINICAL DATA:  Status post fall at living center. Concern for chest or abdominal injury. Initial encounter. EXAM: CT CHEST, ABDOMEN AND PELVIS WITHOUT CONTRAST TECHNIQUE: Multidetector CT imaging of the chest, abdomen and pelvis was performed following the standard protocol without IV contrast. COMPARISON:  Chest radiograph performed 07/15/2015 FINDINGS: CT CHEST FINDINGS Small bilateral pleural effusions are noted, with diffuse patchy bilateral airspace opacification. This may reflect pulmonary edema or atypical infection. No pneumothorax is seen. Scattered coronary artery calcifications are seen. Scattered calcification is noted along the thoracic aorta. The mediastinum is otherwise unremarkable. No mediastinal lymphadenopathy is appreciated. No pericardial effusion is identified. The great vessels are grossly unremarkable. A left-sided chest port is noted with its leads ending at the right atrium and right ventricle. The thyroid gland is unremarkable in appearance. No axillary lymphadenopathy is seen. No acute osseous abnormalities are identified. CT ABDOMEN AND PELVIS FINDINGS The liver and spleen are unremarkable in appearance. The patient is status post cholecystectomy, with clips noted along the gallbladder fossa. The pancreas and adrenal glands are unremarkable. The kidneys are unremarkable in appearance. There is no evidence of hydronephrosis. No renal or ureteral stones are seen. No perinephric stranding is appreciated. No free fluid is identified. The small  bowel is unremarkable in appearance. The stomach is within normal limits. No acute vascular abnormalities are seen. Scattered calcification is noted along the abdominal aorta and its branches. The appendix is normal in caliber and contains air, without evidence of appendicitis. Mild diverticulosis is noted along the sigmoid colon, without evidence of diverticulitis. The bladder is decompressed, with a Foley catheter in place. The patient is status post hysterectomy. No suspicious adnexal masses are seen. No inguinal lymphadenopathy is seen. No acute osseous abnormalities are identified. There are chronic compression deformities involving vertebral bodies T12 and L4, without definite acute fracture. IMPRESSION: 1. No evidence of traumatic injury to the chest, abdomen or pelvis. 2. Small bilateral pleural effusions, with diffuse patchy bilateral airspace opacification. This may reflect pulmonary edema or atypical infection. 3. Scattered coronary artery calcifications seen. 4. Scattered calcification along the abdominal aorta and its branches. 5. Mild diverticulosis along the sigmoid colon, without evidence of diverticulitis. 6. Chronic compression deformities involving vertebral bodies T12 and L4, without definite acute fracture. Electronically Signed   By: Jacqulynn Cadet  Chang M.D.   On: 08/04/2015 06:39   Dg Chest Port 1 View  07/26/2015  CLINICAL DATA:  Shortness of breath.  Sore throat.  Hypoxia. EXAM: PORTABLE CHEST 1 VIEW COMPARISON:  12/29/2013 FINDINGS: New prominently reverse lordotic projection likely due to kyphosis. Dual lead pacer in place, although distal lead tip is below the inferior margin of today's image. Moderate enlargement of the cardiopericardial silhouette. Atherosclerotic aortic arch. Patchy bilateral airspace opacities especially in the perihilar and basilar regions. Indistinct obscuration of the hemidiaphragms, right greater the left. IMPRESSION: 1. Indistinct vasculature with bilateral  airspace opacities and enlargement of the cardiopericardial silhouette, favoring acute pulmonary edema. 2. Atherosclerotic aortic arch. 3. Dual lead pacer in place. 4. Cannot exclude layering pleural effusions. Electronically Signed   By: Van Clines M.D.   On: 08/09/2015 16:50    Assessment/Plan:  81yo F with pacemaker, CHF, aki presents with encephalopathy and fevers, hypoxia, found to have staph aureus bacteremia.  - continue with vancomycin for now. - palliative consultation is in place. Daughter reports that she may not want aggressive care - agree that this would be a complicated case in terms of work up since unlikely to tolerate TEE, if  PM is involved, then treatment will be very difficult to be successful without it's removal and too high risk for patient - will continue to follow if patient and family decide to pursue treatment - agree with palliative consultation - family asked for chaplain services for which I have called and spoken to [facility].  Elzie Rings [facility] [phone]

## 2015-08-05 NOTE — Progress Notes (Signed)
Pt admitted to the unit. Pt is stable per baseline thus far. Bed in lowest position- will continue to monitor.

## 2015-08-05 NOTE — Progress Notes (Signed)
Pulse ox probe and cable had been changed but noted not picking up good , pt is not on distress, continue to monitor.

## 2015-08-05 NOTE — Progress Notes (Signed)
ANTIBIOTIC CONSULT NOTE - FOLLOW UP  Pharmacy Consult:  Vancomycin / Ancef Indication:  Staph bacteremia  Allergies  Allergen Reactions  . Ativan [Lorazepam] Other (See Comments)    Extreme delirium  . Sulfa Antibiotics     unknown    Patient Measurements: Height: 5' 6.14" (168 cm) Weight: 120 lb (54.432 kg) IBW/kg (Calculated) : 59.63  Vital Signs: Temp: 98.2 F (36.8 C) (11/25 0804) Temp Source: Axillary (11/25 0804) BP: 102/59 mmHg (11/25 0804) Pulse Rate: 95 (11/25 0942) Intake/Output from previous day: 11/24 0701 - 11/25 0700 In: 1655.7 [P.O.:100; I.V.:1305.7; IV Piggyback:250] Out: 1100 [Urine:1100] Intake/Output from this shift: Total I/O In: 30 [I.V.:30] Out: -   Labs:  Recent Labs  07/18/2015 1812 08/04/15 1005 08/05/15 0215  WBC 20.5* 24.1* 18.4*  HGB 10.6* 10.8* 9.3*  PLT 144* 176 171  CREATININE 1.48* 1.29* 1.48*   Estimated Creatinine Clearance: 22.6 mL/min (by C-G formula based on Cr of 1.48). No results for input(s): VANCOTROUGH, VANCOPEAK, VANCORANDOM, GENTTROUGH, GENTPEAK, GENTRANDOM, TOBRATROUGH, TOBRAPEAK, TOBRARND, AMIKACINPEAK, AMIKACINTROU, AMIKACIN in the last 72 hours.   Microbiology: Recent Results (from the past 720 hour(s))  Culture, blood (routine x 2)     Status: None (Preliminary result)   Collection Time: 07/17/2015  3:47 PM  Result Value Ref Range Status   Specimen Description BLOOD RIGHT FOREARM  Final   Special Requests BOTTLES DRAWN AEROBIC AND ANAEROBIC 5CC  Final   Culture  Setup Time   Final    GRAM POSITIVE COCCI IN CLUSTERS IN BOTH AEROBIC AND ANAEROBIC BOTTLES CRITICAL RESULT CALLED TO, READ BACK BY AND VERIFIED WITH: T AYERS@[id number]  08/04/15 MKELLY    Culture STAPHYLOCOCCUS AUREUS  Final   Report Status PENDING  Incomplete  Culture, blood (routine x 2)     Status: None (Preliminary result)   Collection Time: 07/15/2015  3:59 PM  Result Value Ref Range Status   Specimen Description BLOOD LEFT ANTECUBITAL  Final   Special Requests BOTTLES DRAWN AEROBIC AND ANAEROBIC 5CC  Final   Culture  Setup Time   Final    GRAM POSITIVE COCCI IN CLUSTERS IN BOTH AEROBIC AND ANAEROBIC BOTTLES CRITICAL RESULT CALLED TO, READ BACK BY AND VERIFIED WITH: T OLEARY 08/04/15 @ 0814 M VESTAL    Culture STAPHYLOCOCCUS AUREUS  Final   Report Status PENDING  Incomplete  Urine culture     Status: None   Collection Time: 07/14/2015  5:11 PM  Result Value Ref Range Status   Specimen Description URINE, CATHETERIZED  Final   Special Requests Normal  Final   Culture NO GROWTH 2 DAYS  Final   Report Status 08/05/2015 FINAL  Final  MRSA PCR Screening     Status: Abnormal   Collection Time: 07/24/2015  6:43 PM  Result Value Ref Range Status   MRSA by PCR POSITIVE (A) NEGATIVE Final    Comment:        The GeneXpert MRSA Assay (FDA approved for NASAL specimens only), is one component of a comprehensive MRSA colonization surveillance program. It is not intended to diagnose MRSA infection nor to guide or monitor treatment for MRSA infections. RESULT CALLED TO, READ BACK BY AND VERIFIED WITH: Claire Shown AYERS RN 2025 07/26/2015 A BROWNING       Assessment: 89 YOF with Staph bacteremia to continue on vancomycin and narrow from Zosyn to Ancef.  Patient's renal function is fluctuating; currently on the rise.  Her UOP, however, remains good at 0.8 ml/kg/hr.  Vanc 11/23 >> Zosyn 11/23 >>  11/25 Ancef 11/25 >>  11/23 UCx - negative 11/23 BCx x2 - S.aureus (preliminary) 11/23 MRSA PCR - positive   Goal of Therapy:  Vancomycin trough level 15-20 mcg/ml   Plan:  - Ancef 1gm IV Q12H - Continue vanc  IV Q24H - Monitor renal fxn, micro data to narrow abx, vanc trough soon if therapy continues - F/U resume home meds    Julie Serrano, PharmD, BCPS Pager:  [phone] 08/05/2015, 10:58 AM

## 2015-08-05 NOTE — Care Management Note (Signed)
Case Management Note  Patient Details  Name: Julie Serrano MRN: [id number] Date of Birth: 1925/09/08  Subjective/Objective:                    Action/Plan: Patient was admitted with CHF, sepsis. [facility]  CSW notified.  CM will continue to follow for any additional discharge needs.  Expected Discharge Date:                  Expected Discharge Plan:  Skilled Nursing Facility  In-House Referral:     Discharge planning Services     Post Acute Care Choice:    Choice offered to:     DME Arranged:    DME Agency:     HH Arranged:    HH Agency:     Status of Service:  In process, will continue to follow  Medicare Important Message Given:    Date Medicare IM Given:    Medicare IM give by:    Date Additional Medicare IM Given:    Additional Medicare Important Message give by:     If discussed at Long Length of Stay Meetings, dates discussed:    Additional Comments:  Anda KraftRobarge, Pao Haffey C, RN 08/05/2015, 10:50 AM

## 2015-08-05 NOTE — Progress Notes (Signed)
Family meeting held. Pt moving to full comfort measures. Will transfer to 6N, abx stopped, no further cardiac monitoring. PO meds stopped Full note to follow. Prognosis of hours to days. Will continue to follow over the weekend Eduard RouxSarah Shawndell Schillaci, ANP

## 2015-08-05 NOTE — Progress Notes (Signed)
   08/05/15 1400  Clinical Encounter Type  Visited With Patient and family together;Health care provider  Visit Type Initial;Psychological support;Spiritual support;Social support  Referral From Care management  Consult/Referral To Chaplain  Spiritual Encounters  Spiritual Needs Sacred text;Prayer  Stress Factors  Family Stress Factors Health changes;Loss of control;Major life changes  Advance Directives (For Healthcare)  Does patient have an advance directive? Yes   Chaplain prayed with family and will check in on family later on.

## 2015-08-05 NOTE — Progress Notes (Signed)
SLP Cancellation Note  Patient Details Name: Lilyan Puntrma H Wollenberg MRN: [id number] DOB: Jan 16, 1925   Cancelled treatment:       Reason Eval/Treat Not Completed: Medical issues which prohibited therapy. Pt is on non-rebreather, just off BiPAP, is lethargic. Will check back tomorrow for readiness for swallow eval.    Gerline Ratto, Riley NearingBonnie Caroline 08/05/2015, 11:58 AM

## 2015-08-06 DIAGNOSIS — A408 Other streptococcal sepsis: Secondary | ICD-10-CM

## 2015-08-06 DIAGNOSIS — Z515 Encounter for palliative care: Secondary | ICD-10-CM

## 2015-08-06 LAB — CULTURE, BLOOD (ROUTINE X 2)

## 2015-08-11 NOTE — Progress Notes (Signed)
Patient was trying to take of blanket and mittens- patient was given PRN dilaudid. About 15 minutes after medication, patient expired. Elray McgregorMary Lynch was been paged- awaiting call back. [place] donor called. Family notified.

## 2015-08-11 NOTE — Consult Note (Signed)
Consultation Note Date: 2015/08/16   Patient Name: Julie Serrano  DOB: Dec 20, 1924  MRN: [id number]  Age / Sex: 81 y.o., female  PCP: Georgeanne Nim, MD Referring Physician: No att. providers found  Reason for Consultation: Establishing goals of care, Non pain symptom management, Pain control, Psychosocial/spiritual support and Terminal Care    Clinical Assessment/Narrative: Pt is a 81 yo female admitted after increased dyspnea. Pt had fallen at SNF and hit her head prior to admission. In ED she was found to have a fever of 104, and 02 sats 82% on RA, lactic acid 3.58, WBC 20.7. Pt was admitted for sepsis. Per CXR she has PNA, as well as UTI. Later blood cx came back positive. Despite antibiotics, 02 support and fluids pt declined and remained encephalopathic. Met with daughter and her brother as well as grandson. Daughter stated that MD had prepared her for a prognosis of just hours. We discussed disease mechanism of an overwhelming infection. We also discussed her mother's wishes at EOL. Pt has a PM and is not a candidate for TEE to r/o PM as an additional source of infection.Both she and her brother were clear that if she could not get better she would not want to prolong dying.Options presented of continued long term antibiotics to suppress infection or comfort care. Family elected comfort care. Pt appears to be transitioning towards actively dying  Contacts/Participants in Discussion: Buford Dresser, brother Primary Decision Maker: Children, usually daughter Tandy Gaw Relationship to Patient children HCPOA: no  Met with one brother and daughter [place]. Her other brother came in this morning to say good-bye. All children are in agreement  SUMMARY OF RECOMMENDATIONS Full comfort care DC abx,  No bipap; cont 02 via facemask if pt can tolerate. Will make an effort to wean down for comfort IVF at [facility] Transfer out of  ICU  Code Status/Advance Care Planning: DNR Advance Directive Documentation        Most Recent Value   Type of Advance Directive  Living will   Pre-existing out of facility DNR order (yellow form or pink MOST form)     "MOST" Form in Place?         Symptom Management:   Pain: Dilauid 0.5 q2 prn  Dyspnea: Cont targeted pulmonary tx of nebs prn, steroids, and opioids PRN, 02.   Secretions: Robinul prn. Congestion only faint at this time  Agitation: Pt has been agitated when awake. Cont prn ativan and will add prn haldol. Hope to DC mittens when calm  Palliative Prophylaxis:   Bowel Regimen, Delirium Protocol, Frequent Pain Assessment and Turn Reposition  Additional Recommendations (Limitations, Scope, Preferences):  Full Comfort Care  Psycho-social/Spiritual:  Support System: Strong Desire for further Chaplaincy support:no  Prognosis: Hours - Days  Discharge Planning: Anticipate hospital death but if she stabilizes would meet criteria for in-patient hospice   Chief Complaint/ Primary Diagnoses: Present on Admission:  . Atrial fibrillation ([place]) . Hypertension . Complete heart block ([place])  I have reviewed the medical record, interviewed the patient and family, and examined the patient. The following aspects are pertinent.  Past Medical History  Diagnosis Date  . Anxiety and depression   . Arthritis   . Atrial fibrillation ([facility])     a. amiodarone  . Hypertension   . Hyperlipemia   . Diverticulitis   . Esophageal stricture   . Complete heart block [facility])     s/p dual chamber MDT PPM implantation April 2015   Social History  Social History  . Marital Status: Unknown    Spouse Name: N/A  . Number of Children: 4  . Years of Education: N/A   Occupational History  . Retired    Social History Main Topics  . Smoking status: Never Smoker   . Smokeless tobacco: Never Used  . Alcohol Use: Yes  . Drug Use: No  . Sexual Activity: Not Currently   Other  Topics Concern  . None   Social History Narrative   History reviewed. No pertinent family history. Scheduled Meds: Continuous Infusions: PRN Meds:. Medications Prior to Admission:  Prior to Admission medications   Medication Sig Start Date End Date Taking? Authorizing Provider  acetaminophen (TYLENOL) 650 MG CR tablet Take 650 mg by mouth every 8 (eight) hours.    Yes Historical Provider, MD  amiodarone (PACERONE) 200 MG tablet Take 1 tablet (200 mg total) by mouth daily. 12/30/13  Yes Brooke O Edmisten, PA-C  calcium carbonate (OS-CAL) 600 MG TABS tablet Take 600 mg by mouth at bedtime.    Yes Historical Provider, MD  cyanocobalamin (,VITAMIN B-12,) 1000 MCG/ML injection Inject 1,000 mcg into the muscle every 30 (thirty) days.   Yes Historical Provider, MD  escitalopram (LEXAPRO) 10 MG tablet Take 10 mg by mouth daily.   Yes Historical Provider, MD  ezetimibe (ZETIA) 10 MG tablet Take 10 mg by mouth at bedtime.    Yes Historical Provider, MD  fluticasone (FLONASE) 50 MCG/ACT nasal spray Place 1 spray into both nostrils daily. 01/07/15  Yes Historical Provider, MD  guaiFENesin-dextromethorphan (ROBITUSSIN DM) 100-10 MG/5ML syrup Take 5 mLs by mouth every 4 (four) hours as needed for cough.   Yes Historical Provider, MD  ipratropium-albuterol (DUONEB) 0.5-2.5 (3) MG/3ML SOLN Take 3 mLs by nebulization every 6 (six) hours.   Yes Historical Provider, MD  latanoprost (XALATAN) 0.005 % ophthalmic solution Place 1 drop into both eyes at bedtime.   Yes Historical Provider, MD  levofloxacin (LEVAQUIN) 250 MG tablet Take 250 mg by mouth daily.   Yes Historical Provider, MD  loratadine (CLARITIN) 10 MG tablet Take 10 mg by mouth daily.   Yes Historical Provider, MD  Menthol, Topical Analgesic, (BIOFREEZE) 4 % GEL Apply 1 application topically every 8 (eight) hours as needed.   Yes Historical Provider, MD  mirtazapine (REMERON) 15 MG tablet Take 7.5 mg by mouth at bedtime.    Yes Historical Provider, MD   pantoprazole (PROTONIX) 40 MG tablet Take 40 mg by mouth 2 (two) times daily.   Yes Historical Provider, MD  sennosides-docusate sodium (SENOKOT-S) 8.6-50 MG tablet Take 1 tablet by mouth 2 (two) times daily.   Yes Historical Provider, MD  sodium chloride (OCEAN) 0.65 % SOLN nasal spray Place 1 spray into both nostrils 4 (four) times daily.    Yes Historical Provider, MD  Vitamin D, Ergocalciferol, (DRISDOL) 50000 UNITS CAPS capsule Take 50,000 Units by mouth every 30 (thirty) days.   Yes Historical Provider, MD  warfarin (COUMADIN) 2 MG tablet Take 2 mg by mouth daily at 6 PM. Per RN at [facility], pt's last dose was 11/21 then held   Yes Historical Provider, MD   Allergies  Allergen Reactions  . Ativan [Lorazepam] Other (See Comments)    Extreme delirium  . Sulfa Antibiotics     unknown    Review of Systems  Unable to perform ROS: Patient unresponsive    Physical Exam  Constitutional: She appears well-developed.  HENT:  Head: Normocephalic.  Cardiovascular:  irrg tachy  Respiratory: She is in respiratory distress.  GI: Soft. Bowel sounds are normal.  Neurological:  Minimally responsive  Skin:  Cool, pale, mottled  Psychiatric:  Unable to test. Has been agitated when awake. In mittens    Vital Signs: BP 107/43 mmHg  Pulse 63  Temp(Src) 97.9 F (36.6 C) (Axillary)  Resp 16  Ht 4' 9"  (1.448 m)  Wt 52.9 kg (116 lb 10 oz)  BMI 25.23 kg/m2  SpO2 97%  SpO2: SpO2: 97 % O2 Device:SpO2: 97 % O2 Flow Rate: .O2 Flow Rate (L/min): 15 L/min  IO: Intake/output summary:  Intake/Output Summary (Last 24 hours) at 2015-08-14 0755 Last data filed at 08/05/15 2000  Gross per 24 hour  Intake    120 ml  Output    220 ml  Net   -100 ml    LBM: Last BM Date:  (unknown) Baseline Weight: Weight: 54.432 kg (120 lb) Most recent weight: Weight: 52.9 kg (116 lb 10 oz)      Palliative Assessment/Data:  Flowsheet Rows        Most Recent Value   Intake Tab    Referral Department   Hospitalist   Unit at Time of Referral  Cardiac/Telemetry Unit   Palliative Care Primary Diagnosis  Sepsis/Infectious Disease   Date Notified  08/05/15   Palliative Care Type  New Palliative care   Date of Admission  07/29/2015   Date first seen by Palliative Care  08/05/15   # of days Palliative referral response time  0 Day(s)   # of days IP prior to Palliative referral  2   Clinical Assessment    Palliative Performance Scale Score  10%   Pain Max last 24 hours  Other (Comment)   Pain Min Last 24 hours  Other (Comment)   Dyspnea Max Last 24 Hours  Other (Comment)   Dyspnea Min Last 24 hours  Other (Comment)   Nausea Max Last 24 Hours  Other (Comment)   Nausea Min Last 24 Hours  Other (Comment)   Anxiety Max Last 24 Hours  Other (Comment)   Anxiety Min Last 24 Hours  Other (Comment)   Other Max Last 24 Hours  Other (Comment)   Psychosocial & Spiritual Assessment    Palliative Care Outcomes    Patient/Family meeting held?  Yes   Palliative Care Outcomes  Improved pain interventions, Improved non-pain symptom therapy, Clarified goals of care, Changed to focus on comfort, Provided psychosocial or spiritual support   Patient/Family wishes: Interventions discontinued/not started   BiPAP, Transfer out of ICU, Antibiotics   Palliative Care follow-up planned  Yes, Facility      Additional Data Reviewed:  CBC:    Component Value Date/Time   WBC 18.4* 08/05/2015 0215   HGB 9.3* 08/05/2015 0215   HCT 29.1* 08/05/2015 0215   PLT 171 08/05/2015 0215   MCV 94.2 08/05/2015 0215   NEUTROABS 17.6* 07/16/2015 1547   LYMPHSABS 0.5* 07/17/2015 1547   MONOABS 2.6* 08/02/2015 1547   EOSABS 0.0 07/28/2015 1547   BASOSABS 0.0 08/05/2015 1547   Comprehensive Metabolic Panel:    Component Value Date/Time   NA 134* 08/05/2015 0215   K 4.1 08/05/2015 0215   CL 104 08/05/2015 0215   CO2 21* 08/05/2015 0215   BUN 45* 08/05/2015 0215   CREATININE 1.48* 08/05/2015 0215   GLUCOSE 171* 08/05/2015  0215   CALCIUM 8.7* 08/05/2015 0215   AST 34 08/05/2015 0215   ALT 24 08/05/2015 0215   ALKPHOS 38  08/05/2015 0215   BILITOT 1.0 08/05/2015 0215   PROT 5.4* 08/05/2015 0215   ALBUMIN 2.6* 08/05/2015 0215     Time In: 4037 Time Out: 1900 Time Total: 90 min Greater than 50%  of this time was spent counseling and coordinating care related to the above assessment and plan.  Signed by: Dory Horn, NP  Dory Horn, NP  2015-08-12, 7:55 AM  Please contact Palliative Medicine Team phone at [phone] for questions and concerns.

## 2015-08-11 NOTE — Discharge Summary (Signed)
Physician Discharge Summary  Julie Serrano MRN: [id number] DOB/AGE: 1925-08-29 81 y.o.  PCP: Georgeanne Nim, MD   Admit date: 07/31/2015 Discharge date: 07-Aug-2015  Discharge Diagnoses:     Principal Problem:   CHF (congestive heart failure) ([place]) Active Problems:   Complete heart block ([place])   Hypertension   Atrial fibrillation ([place])   Sepsis ([place])   AKI (acute kidney injury) ([place])   Staphylococcus aureus bacteremia   Palliative care encounter  Time of death 3.15 a.m.         Medication List    ASK your doctor about these medications        acetaminophen 650 MG CR tablet  Commonly known as:  TYLENOL  Take 650 mg by mouth every 8 (eight) hours.     amiodarone 200 MG tablet  Commonly known as:  PACERONE  Take 1 tablet (200 mg total) by mouth daily.     BIOFREEZE 4 % Gel  Generic drug:  Menthol (Topical Analgesic)  Apply 1 application topically every 8 (eight) hours as needed.     calcium carbonate 600 MG Tabs tablet  Commonly known as:  OS-CAL  Take 600 mg by mouth at bedtime.     cyanocobalamin 1000 MCG/ML injection  Commonly known as:  (VITAMIN B-12)  Inject 1,000 mcg into the muscle every 30 (thirty) days.     escitalopram 10 MG tablet  Commonly known as:  LEXAPRO  Take 10 mg by mouth daily.     ezetimibe 10 MG tablet  Commonly known as:  ZETIA  Take 10 mg by mouth at bedtime.     fluticasone 50 MCG/ACT nasal spray  Commonly known as:  FLONASE  Place 1 spray into both nostrils daily.     guaiFENesin-dextromethorphan 100-10 MG/5ML syrup  Commonly known as:  ROBITUSSIN DM  Take 5 mLs by mouth every 4 (four) hours as needed for cough.     ipratropium-albuterol 0.5-2.5 (3) MG/3ML Soln  Commonly known as:  DUONEB  Take 3 mLs by nebulization every 6 (six) hours.     latanoprost 0.005 % ophthalmic solution  Commonly known as:  XALATAN  Place 1 drop into both eyes at bedtime.     levofloxacin 250 MG tablet  Commonly known as:  LEVAQUIN  Take  250 mg by mouth daily.     loratadine 10 MG tablet  Commonly known as:  CLARITIN  Take 10 mg by mouth daily.     mirtazapine 15 MG tablet  Commonly known as:  REMERON  Take 7.5 mg by mouth at bedtime.     pantoprazole 40 MG tablet  Commonly known as:  PROTONIX  Take 40 mg by mouth 2 (two) times daily.     sennosides-docusate sodium 8.6-50 MG tablet  Commonly known as:  SENOKOT-S  Take 1 tablet by mouth 2 (two) times daily.     sodium chloride 0.65 % Soln nasal spray  Commonly known as:  OCEAN  Place 1 spray into both nostrils 4 (four) times daily.     Vitamin D (Ergocalciferol) 50000 UNITS Caps capsule  Commonly known as:  DRISDOL  Take 50,000 Units by mouth every 30 (thirty) days.     warfarin 2 MG tablet  Commonly known as:  COUMADIN  Take 2 mg by mouth daily at 6 PM. Per RN at [facility], pt's last dose was 11/21 then held         Discharge Condition: Expired  Discharge Instructions  Allergies  Allergen Reactions  . Ativan [Lorazepam] Other (See Comments)    Extreme delirium  . Sulfa Antibiotics     unknown      Disposition: 20-Expired   Consults:  Palliative care    Significant Diagnostic Studies:  Ct Abdomen Pelvis Wo Contrast  08/04/2015  CLINICAL DATA:  Status post fall at living center. Concern for chest or abdominal injury. Initial encounter. EXAM: CT CHEST, ABDOMEN AND PELVIS WITHOUT CONTRAST TECHNIQUE: Multidetector CT imaging of the chest, abdomen and pelvis was performed following the standard protocol without IV contrast. COMPARISON:  Chest radiograph performed 07/28/2015 FINDINGS: CT CHEST FINDINGS Small bilateral pleural effusions are noted, with diffuse patchy bilateral airspace opacification. This may reflect pulmonary edema or atypical infection. No pneumothorax is seen. Scattered coronary artery calcifications are seen. Scattered calcification is noted along the thoracic aorta. The mediastinum is otherwise unremarkable. No  mediastinal lymphadenopathy is appreciated. No pericardial effusion is identified. The great vessels are grossly unremarkable. A left-sided chest port is noted with its leads ending at the right atrium and right ventricle. The thyroid gland is unremarkable in appearance. No axillary lymphadenopathy is seen. No acute osseous abnormalities are identified. CT ABDOMEN AND PELVIS FINDINGS The liver and spleen are unremarkable in appearance. The patient is status post cholecystectomy, with clips noted along the gallbladder fossa. The pancreas and adrenal glands are unremarkable. The kidneys are unremarkable in appearance. There is no evidence of hydronephrosis. No renal or ureteral stones are seen. No perinephric stranding is appreciated. No free fluid is identified. The small bowel is unremarkable in appearance. The stomach is within normal limits. No acute vascular abnormalities are seen. Scattered calcification is noted along the abdominal aorta and its branches. The appendix is normal in caliber and contains air, without evidence of appendicitis. Mild diverticulosis is noted along the sigmoid colon, without evidence of diverticulitis. The bladder is decompressed, with a Foley catheter in place. The patient is status post hysterectomy. No suspicious adnexal masses are seen. No inguinal lymphadenopathy is seen. No acute osseous abnormalities are identified. There are chronic compression deformities involving vertebral bodies T12 and L4, without definite acute fracture. IMPRESSION: 1. No evidence of traumatic injury to the chest, abdomen or pelvis. 2. Small bilateral pleural effusions, with diffuse patchy bilateral airspace opacification. This may reflect pulmonary edema or atypical infection. 3. Scattered coronary artery calcifications seen. 4. Scattered calcification along the abdominal aorta and its branches. 5. Mild diverticulosis along the sigmoid colon, without evidence of diverticulitis. 6. Chronic compression  deformities involving vertebral bodies T12 and L4, without definite acute fracture. Electronically Signed   By: Garald Balding M.D.   On: 08/04/2015 06:39   Ct Head Wo Contrast  08/04/2015  CLINICAL DATA:  Status post fall, with hypoxia. Concern for head injury. Initial encounter. EXAM: CT HEAD WITHOUT CONTRAST TECHNIQUE: Contiguous axial images were obtained from the base of the skull through the vertex without intravenous contrast. COMPARISON:  CT of the head performed 12/26/2013 FINDINGS: There is no evidence of acute infarction, mass lesion, or intra- or extra-axial hemorrhage on CT. Prominence of the ventricles and sulci reflects mild to moderate cortical volume loss. Mild cerebellar atrophy is noted. Scattered periventricular and subcortical white matter change likely reflects small vessel ischemic microangiopathy. The brainstem and fourth ventricle are within normal limits. The basal ganglia are unremarkable in appearance. The cerebral hemispheres demonstrate grossly normal gray-white differentiation. No mass effect or midline shift is seen. There is no evidence of fracture; visualized osseous structures are unremarkable  in appearance. The orbits are within normal limits. The paranasal sinuses and mastoid air cells are well-aerated. No significant soft tissue abnormalities are seen. IMPRESSION: 1. No evidence of traumatic intracranial injury or fracture. 2. Mild to moderate cortical volume loss and scattered small vessel ischemic microangiopathy. Electronically Signed   By: Garald Balding M.D.   On: 08/04/2015 06:33   Ct Chest Wo Contrast  08/04/2015  CLINICAL DATA:  Status post fall at living center. Concern for chest or abdominal injury. Initial encounter. EXAM: CT CHEST, ABDOMEN AND PELVIS WITHOUT CONTRAST TECHNIQUE: Multidetector CT imaging of the chest, abdomen and pelvis was performed following the standard protocol without IV contrast. COMPARISON:  Chest radiograph performed 08/01/2015  FINDINGS: CT CHEST FINDINGS Small bilateral pleural effusions are noted, with diffuse patchy bilateral airspace opacification. This may reflect pulmonary edema or atypical infection. No pneumothorax is seen. Scattered coronary artery calcifications are seen. Scattered calcification is noted along the thoracic aorta. The mediastinum is otherwise unremarkable. No mediastinal lymphadenopathy is appreciated. No pericardial effusion is identified. The great vessels are grossly unremarkable. A left-sided chest port is noted with its leads ending at the right atrium and right ventricle. The thyroid gland is unremarkable in appearance. No axillary lymphadenopathy is seen. No acute osseous abnormalities are identified. CT ABDOMEN AND PELVIS FINDINGS The liver and spleen are unremarkable in appearance. The patient is status post cholecystectomy, with clips noted along the gallbladder fossa. The pancreas and adrenal glands are unremarkable. The kidneys are unremarkable in appearance. There is no evidence of hydronephrosis. No renal or ureteral stones are seen. No perinephric stranding is appreciated. No free fluid is identified. The small bowel is unremarkable in appearance. The stomach is within normal limits. No acute vascular abnormalities are seen. Scattered calcification is noted along the abdominal aorta and its branches. The appendix is normal in caliber and contains air, without evidence of appendicitis. Mild diverticulosis is noted along the sigmoid colon, without evidence of diverticulitis. The bladder is decompressed, with a Foley catheter in place. The patient is status post hysterectomy. No suspicious adnexal masses are seen. No inguinal lymphadenopathy is seen. No acute osseous abnormalities are identified. There are chronic compression deformities involving vertebral bodies T12 and L4, without definite acute fracture. IMPRESSION: 1. No evidence of traumatic injury to the chest, abdomen or pelvis. 2. Small  bilateral pleural effusions, with diffuse patchy bilateral airspace opacification. This may reflect pulmonary edema or atypical infection. 3. Scattered coronary artery calcifications seen. 4. Scattered calcification along the abdominal aorta and its branches. 5. Mild diverticulosis along the sigmoid colon, without evidence of diverticulitis. 6. Chronic compression deformities involving vertebral bodies T12 and L4, without definite acute fracture. Electronically Signed   By: Garald Balding M.D.   On: 08/04/2015 06:39   Dg Chest Port 1 View  08/08/2015  CLINICAL DATA:  Shortness of breath.  Sore throat.  Hypoxia. EXAM: PORTABLE CHEST 1 VIEW COMPARISON:  12/29/2013 FINDINGS: New prominently reverse lordotic projection likely due to kyphosis. Dual lead pacer in place, although distal lead tip is below the inferior margin of today's image. Moderate enlargement of the cardiopericardial silhouette. Atherosclerotic aortic arch. Patchy bilateral airspace opacities especially in the perihilar and basilar regions. Indistinct obscuration of the hemidiaphragms, right greater the left. IMPRESSION: 1. Indistinct vasculature with bilateral airspace opacities and enlargement of the cardiopericardial silhouette, favoring acute pulmonary edema. 2. Atherosclerotic aortic arch. 3. Dual lead pacer in place. 4. Cannot exclude layering pleural effusions. Electronically Signed   By: Cindra Eves.D.  On: 07/15/2015 16:50       Filed Weights   07/19/2015 1551 08/05/15 2053  Weight: 54.432 kg (120 lb) 52.9 kg (116 lb 10 oz)     Microbiology: Recent Results (from the past 240 hour(s))  Culture, blood (routine x 2)     Status: None (Preliminary result)   Collection Time: 07/28/2015  3:47 PM  Result Value Ref Range Status   Specimen Description BLOOD RIGHT FOREARM  Final   Special Requests BOTTLES DRAWN AEROBIC AND ANAEROBIC 5CC  Final   Culture  Setup Time   Final    GRAM POSITIVE COCCI IN CLUSTERS IN BOTH AEROBIC  AND ANAEROBIC BOTTLES CRITICAL RESULT CALLED TO, READ BACK BY AND VERIFIED WITH: T AYERS@[email or web address]  08/04/15 MKELLY    Culture STAPHYLOCOCCUS AUREUS  Final   Report Status PENDING  Incomplete  Culture, blood (routine x 2)     Status: None (Preliminary result)   Collection Time: 07/21/2015  3:59 PM  Result Value Ref Range Status   Specimen Description BLOOD LEFT ANTECUBITAL  Final   Special Requests BOTTLES DRAWN AEROBIC AND ANAEROBIC 5CC  Final   Culture  Setup Time   Final    GRAM POSITIVE COCCI IN CLUSTERS IN BOTH AEROBIC AND ANAEROBIC BOTTLES CRITICAL RESULT CALLED TO, READ BACK BY AND VERIFIED WITH: T OLEARY 08/04/15 @ 0814 M VESTAL    Culture STAPHYLOCOCCUS AUREUS  Final   Report Status PENDING  Incomplete  Urine culture     Status: None   Collection Time: 08/10/2015  5:11 PM  Result Value Ref Range Status   Specimen Description URINE, CATHETERIZED  Final   Special Requests Normal  Final   Culture NO GROWTH 2 DAYS  Final   Report Status 08/05/2015 FINAL  Final  MRSA PCR Screening     Status: Abnormal   Collection Time: 07/26/2015  6:43 PM  Result Value Ref Range Status   MRSA by PCR POSITIVE (A) NEGATIVE Final    Comment:        The GeneXpert MRSA Assay (FDA approved for NASAL specimens only), is one component of a comprehensive MRSA colonization surveillance program. It is not intended to diagnose MRSA infection nor to guide or monitor treatment for MRSA infections. RESULT CALLED TO, READ BACK BY AND VERIFIED WITH: T AYERS RN 2025 07/20/2015 A BROWNING        Blood Culture    Component Value Date/Time   SDES URINE, CATHETERIZED 07/23/2015 1711   SPECREQUEST Normal 07/18/2015 1711   CULT NO GROWTH 2 DAYS 07/16/2015 1711   REPTSTATUS 08/05/2015 FINAL 08/09/2015 1711      Labs: Results for orders placed or performed during the hospital encounter of 08/05/2015 (from the past 48 hour(s))  Glucose, capillary     Status: Abnormal   Collection Time: 08/04/15  8:59 AM  Result  Value Ref Range   Glucose-Capillary 140 (H) 65 - 99 mg/dL  CBC     Status: Abnormal   Collection Time: 08/04/15 10:05 AM  Result Value Ref Range   WBC 24.1 (H) 4.0 - 10.5 K/uL   RBC 3.45 (L) 3.87 - 5.11 MIL/uL   Hemoglobin 10.8 (L) 12.0 - 15.0 g/dL   HCT 31.8 (L) 36.0 - 46.0 %   MCV 92.2 78.0 - 100.0 fL   MCH 31.3 26.0 - 34.0 pg   MCHC 34.0 30.0 - 36.0 g/dL   RDW 14.4 11.5 - 15.5 %   Platelets 176 150 - 400 K/uL  Comprehensive metabolic panel  Status: Abnormal   Collection Time: 08/04/15 10:05 AM  Result Value Ref Range   Sodium 132 (L) 135 - 145 mmol/L   Potassium 4.3 3.5 - 5.1 mmol/L   Chloride 99 (L) 101 - 111 mmol/L   CO2 24 22 - 32 mmol/L   Glucose, Bld 151 (H) 65 - 99 mg/dL   BUN 32 (H) 6 - 20 mg/dL   Creatinine, Ser 1.29 (H) 0.44 - 1.00 mg/dL   Calcium 9.0 8.9 - 10.3 mg/dL   Total Protein 6.0 (L) 6.5 - 8.1 g/dL   Albumin 2.8 (L) 3.5 - 5.0 g/dL   AST 35 15 - 41 U/L   ALT 23 14 - 54 U/L   Alkaline Phosphatase 41 38 - 126 U/L   Total Bilirubin 0.8 0.3 - 1.2 mg/dL   GFR calc non Af Amer 36 (L) >60 mL/min   GFR calc Af Amer 42 (L) >60 mL/min    Comment: (NOTE) The eGFR has been calculated using the CKD EPI equation. This calculation has not been validated in all clinical situations. eGFR's persistently <60 mL/min signify possible Chronic Kidney Disease.    Anion gap 9 5 - 15  Glucose, capillary     Status: Abnormal   Collection Time: 08/04/15 10:59 AM  Result Value Ref Range   Glucose-Capillary 172 (H) 65 - 99 mg/dL  I-STAT 3, arterial blood gas (G3+)     Status: Abnormal   Collection Time: 08/04/15  3:51 PM  Result Value Ref Range   pH, Arterial 7.321 (L) 7.350 - 7.450   pCO2 arterial 39.7 35.0 - 45.0 mmHg   pO2, Arterial 76.0 (L) 80.0 - 100.0 mmHg   Bicarbonate 20.5 20.0 - 24.0 mEq/L   TCO2 22 0 - 100 mmol/L   O2 Saturation 94.0 %   Acid-base deficit 5.0 (H) 0.0 - 2.0 mmol/L   Patient temperature 98.6 F    Collection site RADIAL, ALLEN'S TEST ACCEPTABLE     Drawn by Operator    Sample type ARTERIAL   Glucose, capillary     Status: Abnormal   Collection Time: 08/04/15  4:04 PM  Result Value Ref Range   Glucose-Capillary 150 (H) 65 - 99 mg/dL   Comment 1 Capillary Specimen   Glucose, capillary     Status: Abnormal   Collection Time: 08/04/15  7:43 PM  Result Value Ref Range   Glucose-Capillary 150 (H) 65 - 99 mg/dL   Comment 1 Capillary Specimen   Glucose, capillary     Status: Abnormal   Collection Time: 08/04/15 11:41 PM  Result Value Ref Range   Glucose-Capillary 134 (H) 65 - 99 mg/dL   Comment 1 Capillary Specimen   CBC     Status: Abnormal   Collection Time: 08/05/15  2:15 AM  Result Value Ref Range   WBC 18.4 (H) 4.0 - 10.5 K/uL   RBC 3.09 (L) 3.87 - 5.11 MIL/uL   Hemoglobin 9.3 (L) 12.0 - 15.0 g/dL   HCT 29.1 (L) 36.0 - 46.0 %   MCV 94.2 78.0 - 100.0 fL   MCH 30.1 26.0 - 34.0 pg   MCHC 32.0 30.0 - 36.0 g/dL   RDW 14.6 11.5 - 15.5 %   Platelets 171 150 - 400 K/uL  Comprehensive metabolic panel     Status: Abnormal   Collection Time: 08/05/15  2:15 AM  Result Value Ref Range   Sodium 134 (L) 135 - 145 mmol/L   Potassium 4.1 3.5 - 5.1 mmol/L   Chloride  104 101 - 111 mmol/L   CO2 21 (L) 22 - 32 mmol/L   Glucose, Bld 171 (H) 65 - 99 mg/dL   BUN 45 (H) 6 - 20 mg/dL   Creatinine, Ser 1.48 (H) 0.44 - 1.00 mg/dL   Calcium 8.7 (L) 8.9 - 10.3 mg/dL   Total Protein 5.4 (L) 6.5 - 8.1 g/dL   Albumin 2.6 (L) 3.5 - 5.0 g/dL   AST 34 15 - 41 U/L   ALT 24 14 - 54 U/L   Alkaline Phosphatase 38 38 - 126 U/L   Total Bilirubin 1.0 0.3 - 1.2 mg/dL   GFR calc non Af Amer 30 (L) >60 mL/min   GFR calc Af Amer 35 (L) >60 mL/min    Comment: (NOTE) The eGFR has been calculated using the CKD EPI equation. This calculation has not been validated in all clinical situations. eGFR's persistently <60 mL/min signify possible Chronic Kidney Disease.    Anion gap 9 5 - 15  Glucose, capillary     Status: Abnormal   Collection Time: 08/05/15   4:24 AM  Result Value Ref Range   Glucose-Capillary 155 (H) 65 - 99 mg/dL   Comment 1 Capillary Specimen   Glucose, capillary     Status: Abnormal   Collection Time: 08/05/15  8:02 AM  Result Value Ref Range   Glucose-Capillary 138 (H) 65 - 99 mg/dL   Comment 1 Capillary Specimen   Glucose, capillary     Status: Abnormal   Collection Time: 08/05/15 12:04 PM  Result Value Ref Range   Glucose-Capillary 121 (H) 65 - 99 mg/dL   Comment 1 Capillary Specimen   Glucose, capillary     Status: Abnormal   Collection Time: 08/05/15  4:56 PM  Result Value Ref Range   Glucose-Capillary 132 (H) 65 - 99 mg/dL   Comment 1 Capillary Specimen   Glucose, capillary     Status: Abnormal   Collection Time: 08/05/15  7:37 PM  Result Value Ref Range   Glucose-Capillary 140 (H) 65 - 99 mg/dL     Lipid Panel  No results found for: CHOL, TRIG, HDL, CHOLHDL, VLDL, LDLCALC, LDLDIRECT   No results found for: HGBA1C   Lab Results  Component Value Date   CREATININE 1.48* 08/05/2015     81 year old female with a history of paroxysmal atrial fibrillation, hypertension, dyslipidemia, cholelithiasis with dilated common bile duct, complete heart block status post pacemaker followed by Dr. Lovena Le of cardiology, currently at a long-term care facility, who presents to the ER for worsening shortness of breath since Sunday. Patient is on a nonrebreather and her daughter provides most of the history. Patient started developing generalized weakness and lightheadedness, and had a fall on Sunday, fell backwards and hit the back of her head. She had blood work done at the nursing home that revealed that the patient possibly have a UTI, started on Levaquin 250 mg a day on Sunday. However patient shortness of breath progressively got worse, associated with a productive cough. Brought in by EMS and was found to have a temperature of 104, oxygen saturation of 82% on room air, initial blood pressure 1 19 /74. Heart rate 106, 84%  on 5 L. Patient subsequently placed on a nonrebreather. Fund of a sodium of 125, acute kidney injury with a creatinine of 1.56, glucose 203, [id number]. Troponin 0.55. Lactic acid 3.58. White blood cell count of 20.7. Patient is being admitted for sepsis to step down. Chest x-ray shows indistinct opacity, pneumonia versus  CHF   Assessment and plan Acute Hypoxemic respiratory failure Secondary to acute CHF exacerbation versus healthcare associated associated pneumonia vs sepsis ABG consistent with hypoxemic respiratory failure, blood cultures positive for gram-positive cocci in clusters , pro-calcitonin 0.25, lactic acid elevated Continue broad-spectrum antibiotics Requiring BiPAP support, now on [place] ,  Continue step down Prognosis poor , discussed with the patient's daughter palliative care consultation oredred ,cannot have work up for bacteremia , or TEE , Patient was anticipated to die within 24 hours and this was discussed with the patient's daughter in the presence of R taking care of the patientN   HCAP-patient started on vancomycin and Zosyn, blood culture positive for gram-positive clusters, CT scan of the chest chest, abdomen, pelvis showed small bilateral pleural effusions with diffuse patchy bilateral airspace opacification consistent with pneumonia. White count Better ,cont broad-spectrum antibiotics, await speciation on BC    Chronic diastolic CHF (congestive heart failure) without exacerbation (HCC)-BNP elevated, however patient appears to be dehydrated clinically and acute kidney injury Will hold off on diuresis, 2-D echo showed EF of 55-60% with normal wall motion Continue gentle IV fluids   Recurrent falls-likely in the setting of dehydration and orthostasis, CT of the head ordered to rule out head trauma in the setting of 2 recent falls   Complete heart block (HCC)-patient has a pacemaker, abnormal troponin, likely in the setting of sepsis/ AKI , cardiology evaluated  pacemaker in the setting of positive blood cultures.Prior cardiology visit noted, patient not felt to be TEE candidate at this time.await further discussion with family regarding mention of palliative care eval, will await prior to further recommendations    Hypertension-hold antihypertensive medications at this time as BP soft, continue IV fluids   Atrial fibrillation (HCC)-rate controlled, continue amiodarone, not on anticoagulation, but on coumadin at home ?   Sepsis (HCC)-sepsis protocol initiated, follow serial pro-calcitonin, lactic acid, patient started on broad-spectrum antibiotics,GRAM POSITIVE COCCI IN CLUSTERS, speciation pending    AKI (acute kidney injury) (HCC)-Baseline creatinine around 1.0, slight improvement from 1.56> 1.29>1.48                 Signed: Natori Gudino 08-10-2015, 8:32 AM        Time spent >45 mins

## 2015-08-11 DEATH — deceased

## 2015-08-23 ENCOUNTER — Encounter: Payer: Medicare Other | Admitting: Internal Medicine

## 2016-07-27 IMAGING — CT CT CHEST W/O CM
2 of 4 series · 9 of 46 positions shown, 10 images · non-contrast
Comparison: Chest radiograph performed 08/03/2015

CLINICAL DATA: Status post fall at living center. Concern for chest
or abdominal injury. Initial encounter.

EXAM:
CT CHEST, ABDOMEN AND PELVIS WITHOUT CONTRAST
TECHNIQUE: Multidetector CT imaging of the chest, abdomen and pelvis was
performed following the standard protocol without IV contrast.

[Series 301: cap with, idose (2) · axial · 0.68mm/px · z∈[+162,+642]mm · 6 of 120 slices shown, 7 images]
[im 12/120  soft-tissue]
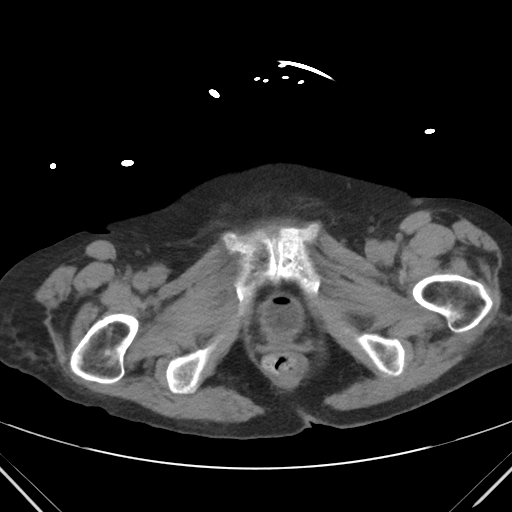
[im 12/120  bone]
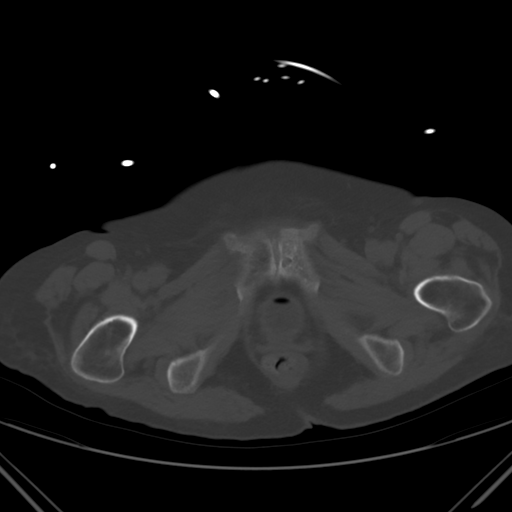
[im 30/120  soft-tissue]
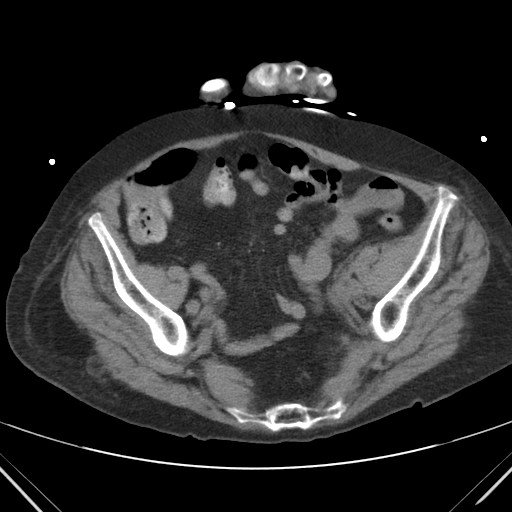
[im 48/120  soft-tissue]
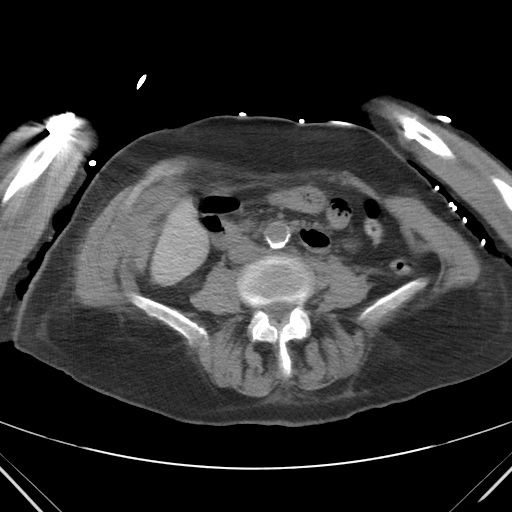
[im 72/120  soft-tissue]
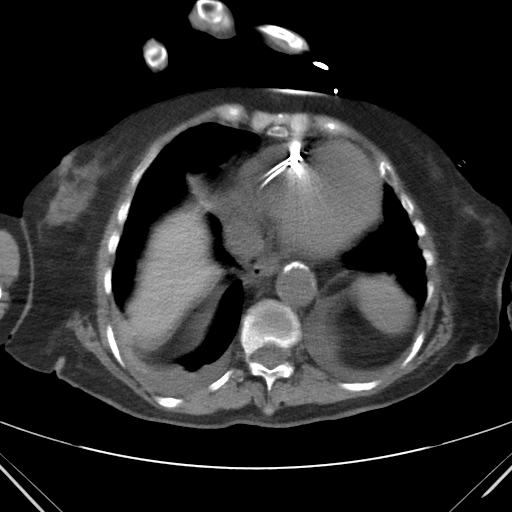
[im 90/120  soft-tissue]
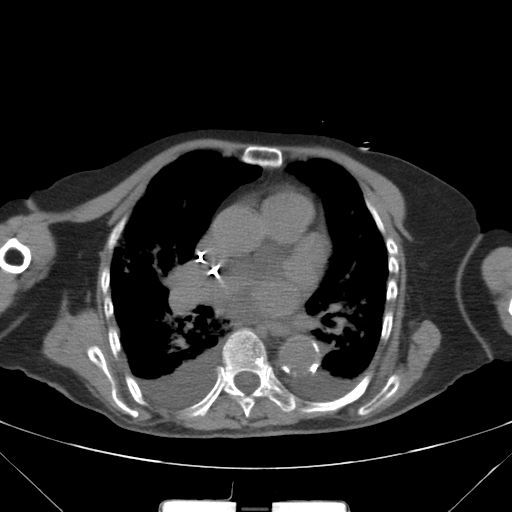
[im 108/120  soft-tissue]
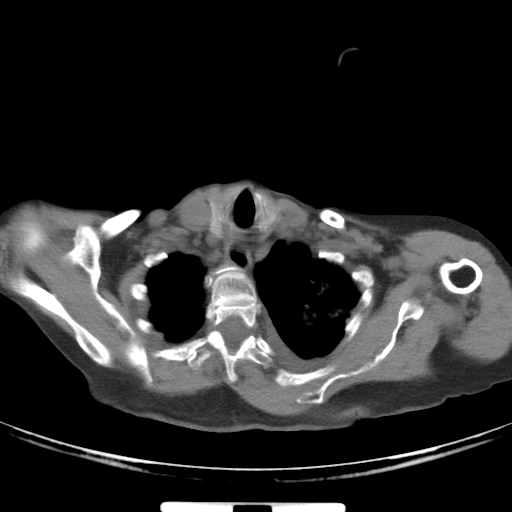

[Series 303: coronals, idose (2) · coronal · 0.45mm/px · 3 of 139 slices shown]
[im 47/139  soft-tissue]
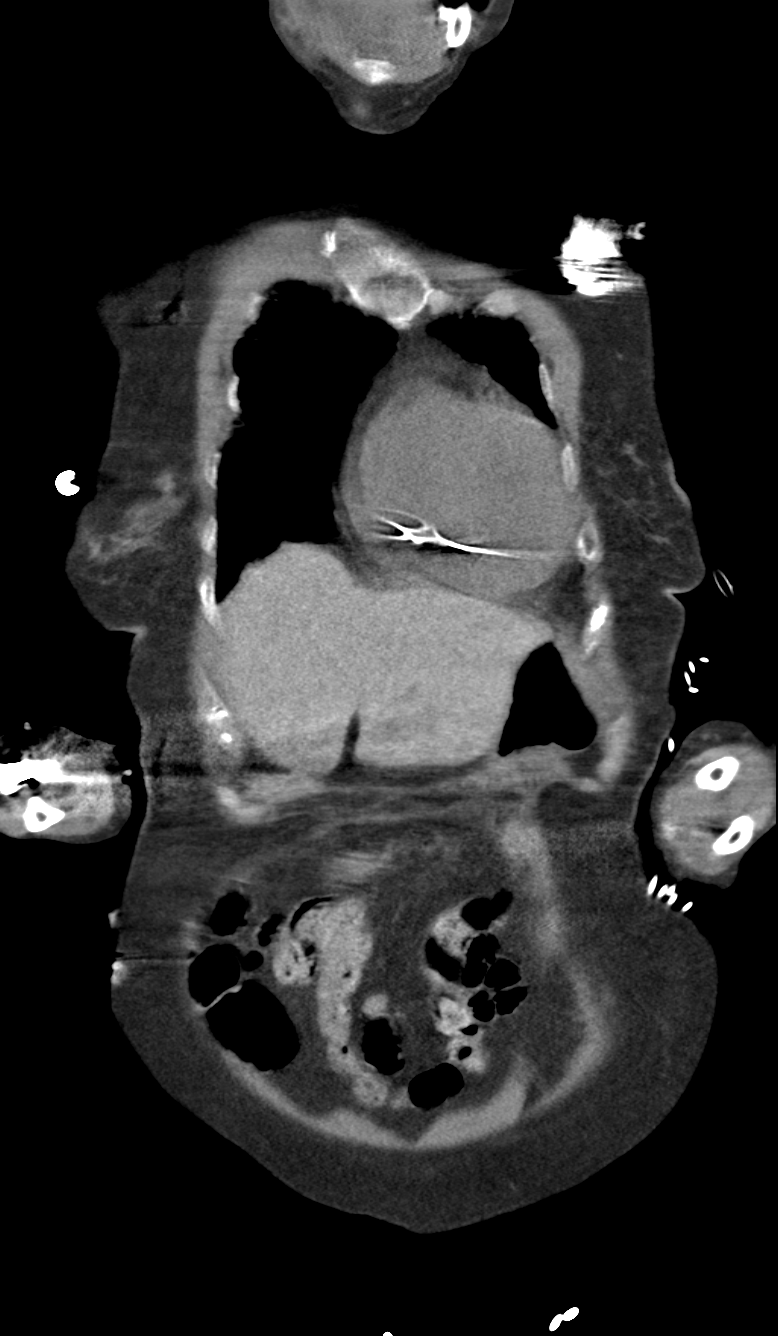
[im 62/139  soft-tissue]
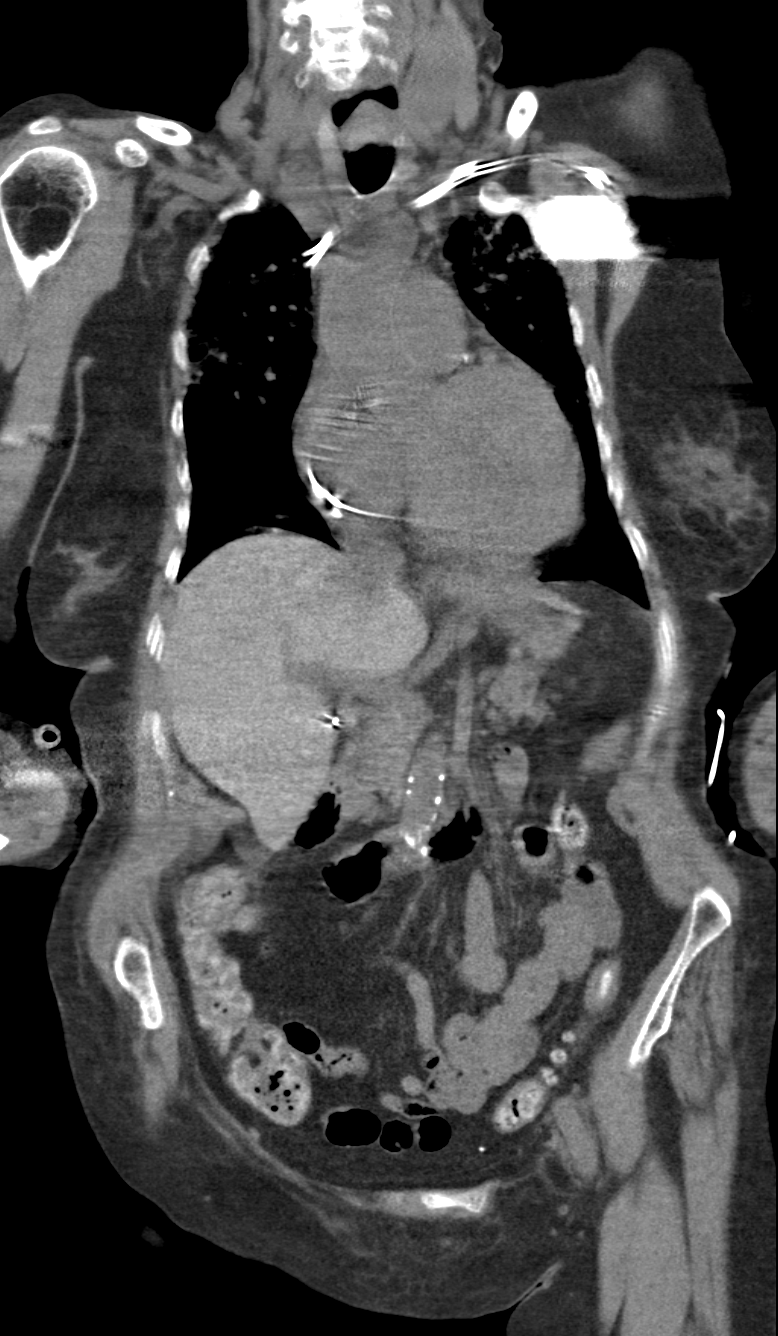
[im 77/139  soft-tissue]
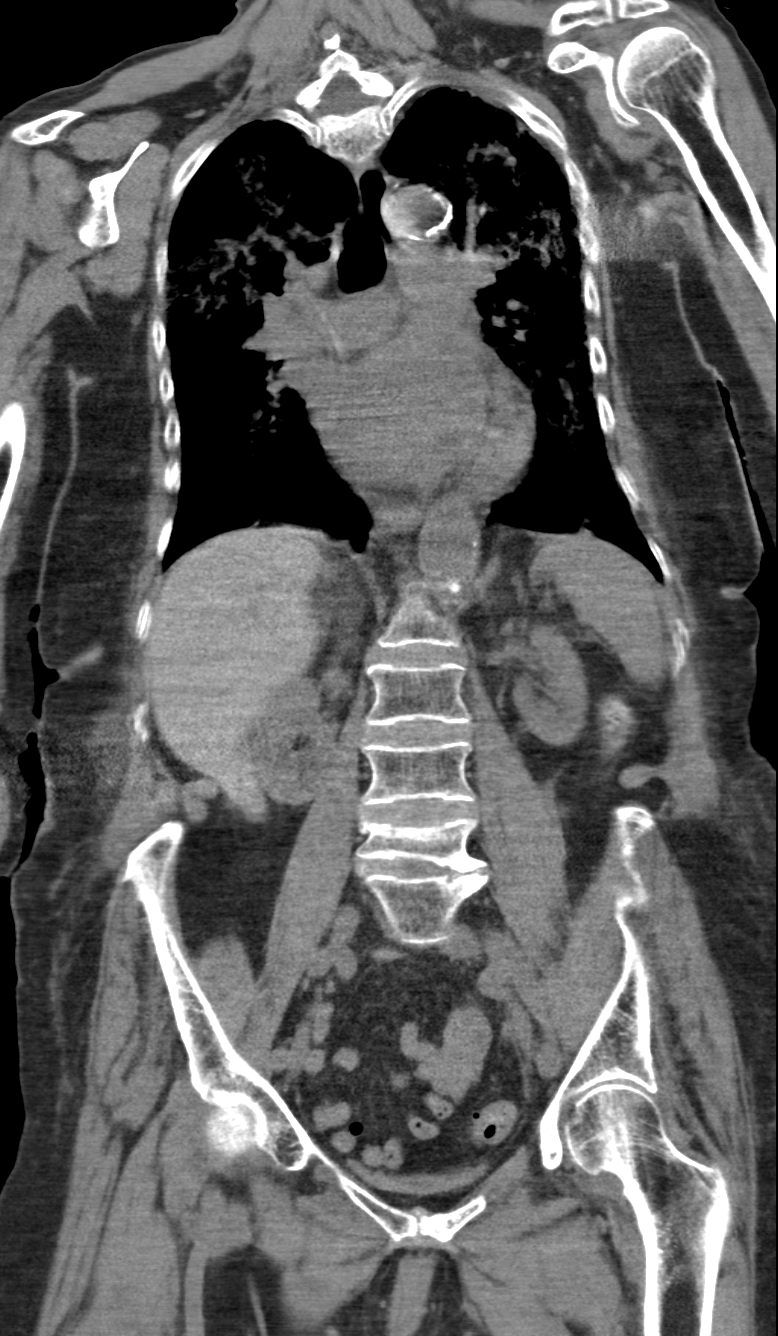

[9 of 46 positions shown; findings below may reference images not displayed]

FINDINGS: CT CHEST FINDINGS

Small bilateral pleural effusions are noted, with diffuse patchy
bilateral airspace opacification. This may reflect pulmonary edema
or atypical infection. No pneumothorax is seen.

Scattered coronary artery calcifications are seen. Scattered
calcification is noted along the thoracic aorta. The mediastinum is
otherwise unremarkable. No mediastinal lymphadenopathy is
appreciated. No pericardial effusion is identified. The great
vessels are grossly unremarkable. A left-sided chest port is noted
with its leads ending at the right atrium and right ventricle.

The thyroid gland is unremarkable in appearance. No axillary
lymphadenopathy is seen.

No acute osseous abnormalities are identified.

CT ABDOMEN AND PELVIS FINDINGS

The liver and spleen are unremarkable in appearance. The patient is
status post cholecystectomy, with clips noted along the gallbladder
fossa. The pancreas and adrenal glands are unremarkable.

The kidneys are unremarkable in appearance. There is no evidence of
hydronephrosis. No renal or ureteral stones are seen. No perinephric
stranding is appreciated.

No free fluid is identified. The small bowel is unremarkable in
appearance. The stomach is within normal limits. No acute vascular
abnormalities are seen. Scattered calcification is noted along the
abdominal aorta and its branches.

The appendix is normal in caliber and contains air, without evidence
of appendicitis. Mild diverticulosis is noted along the sigmoid
colon, without evidence of diverticulitis.

The bladder is decompressed, with a Foley catheter in place. The
patient is status post hysterectomy. No suspicious adnexal masses
are seen. No inguinal lymphadenopathy is seen.

No acute osseous abnormalities are identified. There are chronic
compression deformities involving vertebral bodies T12 and L4,
without definite acute fracture.
IMPRESSION: 1. No evidence of traumatic injury to the chest, abdomen or pelvis.
2. Small bilateral pleural effusions, with diffuse patchy bilateral
airspace opacification. This may reflect pulmonary edema or atypical
infection.
3. Scattered coronary artery calcifications seen.
4. Scattered calcification along the abdominal aorta and its
branches.
5. Mild diverticulosis along the sigmoid colon, without evidence of
diverticulitis.
6. Chronic compression deformities involving vertebral bodies T12
and L4, without definite acute fracture.
